# Patient Record
Sex: Female | Born: 1990 | Race: White | Hispanic: No | Marital: Single | Smoking: Current every day smoker
Health system: Southern US, Community
[De-identification: ages and names within clinical notes are randomized; demographics above are authoritative.]

## PROBLEM LIST (undated history)

## (undated) ENCOUNTER — Inpatient Hospital Stay: Payer: Self-pay

## (undated) DIAGNOSIS — F4310 Post-traumatic stress disorder, unspecified: Secondary | ICD-10-CM

## (undated) DIAGNOSIS — F32A Depression, unspecified: Secondary | ICD-10-CM

## (undated) DIAGNOSIS — F411 Generalized anxiety disorder: Secondary | ICD-10-CM

## (undated) DIAGNOSIS — F329 Major depressive disorder, single episode, unspecified: Secondary | ICD-10-CM

## (undated) DIAGNOSIS — F603 Borderline personality disorder: Secondary | ICD-10-CM

## (undated) DIAGNOSIS — F909 Attention-deficit hyperactivity disorder, unspecified type: Secondary | ICD-10-CM

## (undated) HISTORY — PX: DILATION AND CURETTAGE OF UTERUS: SHX78

---
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
## 2008-09-02 ENCOUNTER — Emergency Department: Payer: Self-pay | Admitting: Emergency Medicine

## 2009-02-21 ENCOUNTER — Emergency Department: Payer: Self-pay | Admitting: Emergency Medicine

## 2009-09-03 ENCOUNTER — Inpatient Hospital Stay: Payer: Self-pay | Admitting: Internal Medicine

## 2009-09-04 ENCOUNTER — Inpatient Hospital Stay: Payer: Self-pay | Admitting: Psychiatry

## 2009-09-10 ENCOUNTER — Emergency Department: Payer: Self-pay | Admitting: Emergency Medicine

## 2009-09-11 ENCOUNTER — Emergency Department: Payer: Self-pay | Admitting: Emergency Medicine

## 2009-10-13 ENCOUNTER — Emergency Department: Payer: Self-pay | Admitting: Emergency Medicine

## 2010-01-12 ENCOUNTER — Emergency Department: Payer: Self-pay | Admitting: Emergency Medicine

## 2010-05-03 ENCOUNTER — Emergency Department: Payer: Self-pay | Admitting: Emergency Medicine

## 2010-11-29 ENCOUNTER — Observation Stay: Payer: Self-pay

## 2010-12-10 ENCOUNTER — Inpatient Hospital Stay: Payer: Self-pay | Admitting: Obstetrics & Gynecology

## 2011-02-02 ENCOUNTER — Emergency Department: Payer: Self-pay | Admitting: Emergency Medicine

## 2011-02-03 ENCOUNTER — Ambulatory Visit: Payer: Self-pay | Admitting: Internal Medicine

## 2011-02-28 ENCOUNTER — Emergency Department: Payer: Self-pay | Admitting: Emergency Medicine

## 2011-05-06 ENCOUNTER — Ambulatory Visit: Payer: Self-pay | Admitting: Internal Medicine

## 2011-05-29 ENCOUNTER — Emergency Department: Payer: Self-pay | Admitting: Emergency Medicine

## 2011-07-16 ENCOUNTER — Emergency Department: Payer: Self-pay | Admitting: Emergency Medicine

## 2011-08-29 ENCOUNTER — Emergency Department: Payer: Self-pay | Admitting: Emergency Medicine

## 2011-10-01 DIAGNOSIS — F1320 Sedative, hypnotic or anxiolytic dependence, uncomplicated: Secondary | ICD-10-CM | POA: Insufficient documentation

## 2011-10-25 ENCOUNTER — Emergency Department: Payer: Self-pay | Admitting: Emergency Medicine

## 2011-10-25 LAB — CBC
HCT: 43.6 % (ref 35.0–47.0)
HGB: 14.8 g/dL (ref 12.0–16.0)
MCHC: 33.9 g/dL (ref 32.0–36.0)

## 2011-10-25 LAB — COMPREHENSIVE METABOLIC PANEL
Albumin: 4.6 g/dL (ref 3.4–5.0)
Alkaline Phosphatase: 37 U/L — ABNORMAL LOW (ref 50–136)
Bilirubin,Total: 1.4 mg/dL — ABNORMAL HIGH (ref 0.2–1.0)
Co2: 26 mmol/L (ref 21–32)
Creatinine: 0.61 mg/dL (ref 0.60–1.30)
EGFR (Non-African Amer.): >60
Glucose: 98 mg/dL (ref 65–99)
Osmolality: 274 (ref 275–301)
Potassium: 3.3 mmol/L — ABNORMAL LOW (ref 3.5–5.1)
Sodium: 138 mmol/L (ref 136–145)

## 2011-10-25 LAB — URINALYSIS, COMPLETE
Bilirubin,UR: NEGATIVE
Blood: NEGATIVE
Ph: 5 (ref 4.5–8.0)
Protein: 30
Specific Gravity: 1.028 (ref 1.003–1.030)
Squamous Epithelial: 7

## 2011-10-25 LAB — PREGNANCY, URINE: Pregnancy Test, Urine: POSITIVE m[IU]/mL

## 2011-11-06 ENCOUNTER — Emergency Department: Payer: Self-pay | Admitting: Emergency Medicine

## 2011-12-24 ENCOUNTER — Emergency Department: Payer: Self-pay | Admitting: *Deleted

## 2011-12-24 LAB — URINALYSIS, COMPLETE
Bilirubin,UR: NEGATIVE
Leukocyte Esterase: NEGATIVE
Ph: 6 (ref 4.5–8.0)
Specific Gravity: 1.028 (ref 1.003–1.030)
Squamous Epithelial: 4
WBC UR: 6 /HPF (ref 0–5)

## 2011-12-24 LAB — CBC
HCT: 39.3 % (ref 35.0–47.0)
HGB: 13.5 g/dL (ref 12.0–16.0)
MCH: 32.4 pg (ref 26.0–34.0)
MCHC: 34.4 g/dL (ref 32.0–36.0)
Platelet: 252 10*3/uL (ref 150–440)

## 2011-12-24 LAB — COMPREHENSIVE METABOLIC PANEL
Alkaline Phosphatase: 29 U/L — ABNORMAL LOW (ref 50–136)
Bilirubin,Total: 1 mg/dL (ref 0.2–1.0)
Calcium, Total: 9 mg/dL (ref 8.5–10.1)
Chloride: 99 mmol/L (ref 98–107)
Co2: 26 mmol/L (ref 21–32)
EGFR (African American): >60
SGPT (ALT): 20 U/L
Sodium: 137 mmol/L (ref 136–145)

## 2011-12-24 LAB — HCG, QUANTITATIVE, PREGNANCY: Beta Hcg, Quant.: 37294 m[IU]/mL — ABNORMAL HIGH

## 2012-04-11 ENCOUNTER — Observation Stay: Payer: Self-pay

## 2012-04-11 LAB — URINALYSIS, COMPLETE
Bilirubin,UR: NEGATIVE
Blood: NEGATIVE
Glucose,UR: NEGATIVE mg/dL (ref 0–75)
Ketone: NEGATIVE
Nitrite: NEGATIVE
Ph: 6 (ref 4.5–8.0)
Squamous Epithelial: 12

## 2012-06-08 ENCOUNTER — Observation Stay: Payer: Self-pay | Admitting: Obstetrics and Gynecology

## 2012-06-12 ENCOUNTER — Observation Stay: Payer: Self-pay

## 2012-06-17 ENCOUNTER — Inpatient Hospital Stay: Payer: Self-pay | Admitting: Obstetrics and Gynecology

## 2012-06-17 ENCOUNTER — Encounter: Payer: Self-pay | Admitting: Maternal and Fetal Medicine

## 2012-06-17 LAB — CBC WITH DIFFERENTIAL/PLATELET
Basophil #: 0 10*3/uL (ref 0.0–0.1)
Basophil %: 0.3 %
Eosinophil %: 0.4 %
Lymphocyte #: 2.7 10*3/uL (ref 1.0–3.6)
MCH: 32.2 pg (ref 26.0–34.0)
Monocyte #: 1.3 x10 3/mm — ABNORMAL HIGH (ref 0.2–0.9)
Monocyte %: 9.2 %
Neutrophil #: 9.6 10*3/uL — ABNORMAL HIGH (ref 1.4–6.5)
Platelet: 228 10*3/uL (ref 150–440)
RBC: 3.94 10*6/uL (ref 3.80–5.20)
RDW: 13 % (ref 11.5–14.5)
WBC: 13.7 10*3/uL — ABNORMAL HIGH (ref 3.6–11.0)

## 2012-06-18 LAB — HEMATOCRIT: HCT: 34.1 % — ABNORMAL LOW (ref 35.0–47.0)

## 2012-08-07 ENCOUNTER — Emergency Department: Payer: Self-pay | Admitting: Emergency Medicine

## 2012-08-07 LAB — CBC
HCT: 41.2 % (ref 35.0–47.0)
HGB: 14 g/dL (ref 12.0–16.0)
MCH: 31.5 pg (ref 26.0–34.0)
MCHC: 34 g/dL (ref 32.0–36.0)
MCV: 93 fL (ref 80–100)
RBC: 4.45 10*6/uL (ref 3.80–5.20)
WBC: 11.7 10*3/uL — ABNORMAL HIGH (ref 3.6–11.0)

## 2012-08-07 LAB — URINALYSIS, COMPLETE
Bilirubin,UR: NEGATIVE
Blood: NEGATIVE
Glucose,UR: NEGATIVE mg/dL (ref 0–75)
Ph: 6 (ref 4.5–8.0)
Protein: 30
Specific Gravity: 1.03 (ref 1.003–1.030)
Squamous Epithelial: 6

## 2012-08-07 LAB — BASIC METABOLIC PANEL
Chloride: 105 mmol/L (ref 98–107)
Co2: 24 mmol/L (ref 21–32)
Creatinine: 0.8 mg/dL (ref 0.60–1.30)
Sodium: 138 mmol/L (ref 136–145)

## 2012-08-07 LAB — HCG, QUANTITATIVE, PREGNANCY: Beta Hcg, Quant.: <1 m[IU]/mL — ABNORMAL LOW

## 2012-08-11 LAB — URINE CULTURE

## 2012-09-08 ENCOUNTER — Emergency Department: Payer: Self-pay | Admitting: Emergency Medicine

## 2012-09-08 LAB — URINALYSIS, COMPLETE
Glucose,UR: NEGATIVE mg/dL (ref 0–75)
Nitrite: NEGATIVE
RBC,UR: 125 /HPF (ref 0–5)
Specific Gravity: 1.027 (ref 1.003–1.030)
Squamous Epithelial: 6
WBC UR: 87 /HPF (ref 0–5)

## 2012-09-12 ENCOUNTER — Emergency Department: Payer: Self-pay | Admitting: Emergency Medicine

## 2012-09-12 LAB — BASIC METABOLIC PANEL
Chloride: 105 mmol/L (ref 98–107)
Co2: 27 mmol/L (ref 21–32)
Creatinine: 0.83 mg/dL (ref 0.60–1.30)
EGFR (African American): >60
EGFR (Non-African Amer.): >60
Sodium: 138 mmol/L (ref 136–145)

## 2012-09-12 LAB — CBC
HCT: 39.1 % (ref 35.0–47.0)
HGB: 12.6 g/dL (ref 12.0–16.0)
MCH: 30.3 pg (ref 26.0–34.0)
MCV: 94 fL (ref 80–100)
Platelet: 209 10*3/uL (ref 150–440)
RDW: 13.9 % (ref 11.5–14.5)
WBC: 13.2 10*3/uL — ABNORMAL HIGH (ref 3.6–11.0)

## 2012-09-20 DIAGNOSIS — F17200 Nicotine dependence, unspecified, uncomplicated: Secondary | ICD-10-CM | POA: Insufficient documentation

## 2012-11-05 ENCOUNTER — Emergency Department: Payer: Self-pay | Admitting: Emergency Medicine

## 2012-11-05 LAB — URINALYSIS, COMPLETE
Bacteria: NONE SEEN
Bilirubin,UR: NEGATIVE
Blood: NEGATIVE
Granular Cast: 10
Ph: 6 (ref 4.5–8.0)
RBC,UR: 4 /HPF (ref 0–5)
Specific Gravity: 1.028 (ref 1.003–1.030)
Squamous Epithelial: 4

## 2012-11-05 LAB — COMPREHENSIVE METABOLIC PANEL
Alkaline Phosphatase: 38 U/L — ABNORMAL LOW (ref 50–136)
Anion Gap: 12 (ref 7–16)
BUN: 8 mg/dL (ref 7–18)
Bilirubin,Total: 2 mg/dL — ABNORMAL HIGH (ref 0.2–1.0)
Calcium, Total: 8.8 mg/dL (ref 8.5–10.1)
Chloride: 106 mmol/L (ref 98–107)
Co2: 19 mmol/L — ABNORMAL LOW (ref 21–32)
Creatinine: 0.86 mg/dL (ref 0.60–1.30)
EGFR (African American): >60
Glucose: 160 mg/dL — ABNORMAL HIGH (ref 65–99)
Sodium: 137 mmol/L (ref 136–145)
Total Protein: 7.1 g/dL (ref 6.4–8.2)

## 2012-11-05 LAB — CBC
MCH: 30.7 pg (ref 26.0–34.0)
MCHC: 33.1 g/dL (ref 32.0–36.0)
Platelet: 295 10*3/uL (ref 150–440)
RDW: 13.3 % (ref 11.5–14.5)
WBC: 14.2 10*3/uL — ABNORMAL HIGH (ref 3.6–11.0)

## 2012-11-05 LAB — ETHANOL: Ethanol %: <0.003 % (ref 0.000–0.080)

## 2012-11-05 LAB — PREGNANCY, URINE: Pregnancy Test, Urine: NEGATIVE m[IU]/mL
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
## 2013-08-06 ENCOUNTER — Emergency Department: Payer: Self-pay | Admitting: Emergency Medicine

## 2013-08-06 LAB — URINALYSIS, COMPLETE
Nitrite: NEGATIVE
Specific Gravity: 1.016 (ref 1.003–1.030)
Squamous Epithelial: 6

## 2013-08-06 LAB — WET PREP, GENITAL

## 2013-08-06 LAB — GC/CHLAMYDIA PROBE AMP

## 2013-10-30 DIAGNOSIS — F1210 Cannabis abuse, uncomplicated: Secondary | ICD-10-CM | POA: Insufficient documentation

## 2013-10-30 DIAGNOSIS — F4310 Post-traumatic stress disorder, unspecified: Secondary | ICD-10-CM | POA: Insufficient documentation

## 2013-10-30 DIAGNOSIS — F603 Borderline personality disorder: Secondary | ICD-10-CM | POA: Insufficient documentation

## 2013-10-31 DIAGNOSIS — F489 Nonpsychotic mental disorder, unspecified: Secondary | ICD-10-CM | POA: Insufficient documentation

## 2014-03-11 DIAGNOSIS — F1320 Sedative, hypnotic or anxiolytic dependence, uncomplicated: Secondary | ICD-10-CM | POA: Insufficient documentation

## 2014-03-11 DIAGNOSIS — F1020 Alcohol dependence, uncomplicated: Secondary | ICD-10-CM | POA: Insufficient documentation

## 2014-07-29 ENCOUNTER — Emergency Department: Payer: Self-pay | Admitting: Emergency Medicine

## 2014-07-29 LAB — BASIC METABOLIC PANEL
Anion Gap: 7 (ref 7–16)
BUN: 11 mg/dL (ref 7–18)
CO2: 25 mmol/L (ref 21–32)
Calcium, Total: 8.8 mg/dL (ref 8.5–10.1)
Chloride: 106 mmol/L (ref 98–107)
Creatinine: 0.69 mg/dL (ref 0.60–1.30)
EGFR (Non-African Amer.): >60
GLUCOSE: 86 mg/dL (ref 65–99)
OSMOLALITY: 274 (ref 275–301)
Potassium: 3.6 mmol/L (ref 3.5–5.1)
Sodium: 138 mmol/L (ref 136–145)

## 2014-07-29 LAB — CBC
HCT: 40.2 % (ref 35.0–47.0)
HGB: 13.4 g/dL (ref 12.0–16.0)
MCH: 31.3 pg (ref 26.0–34.0)
MCHC: 33.2 g/dL (ref 32.0–36.0)
MCV: 94 fL (ref 80–100)
Platelet: 269 10*3/uL (ref 150–440)
RBC: 4.27 10*6/uL (ref 3.80–5.20)
RDW: 13.4 % (ref 11.5–14.5)
WBC: 11.8 10*3/uL — AB (ref 3.6–11.0)

## 2014-07-29 LAB — URINALYSIS, COMPLETE
BILIRUBIN, UR: NEGATIVE
Bacteria: NONE SEEN
Blood: NEGATIVE
Glucose,UR: NEGATIVE mg/dL (ref 0–75)
LEUKOCYTE ESTERASE: NEGATIVE
Nitrite: POSITIVE
PH: 6 (ref 4.5–8.0)
Protein: NEGATIVE
Specific Gravity: 1.012 (ref 1.003–1.030)
Squamous Epithelial: 4
WBC UR: <1 /HPF (ref 0–5)

## 2014-08-05 DIAGNOSIS — F1110 Opioid abuse, uncomplicated: Secondary | ICD-10-CM | POA: Insufficient documentation

## 2014-09-24 ENCOUNTER — Emergency Department: Payer: Self-pay | Admitting: Emergency Medicine

## 2014-09-25 LAB — COMPREHENSIVE METABOLIC PANEL
ALBUMIN: 4.1 g/dL (ref 3.4–5.0)
ALT: 19 U/L
ANION GAP: 9 (ref 7–16)
AST: 12 U/L — AB (ref 15–37)
Alkaline Phosphatase: 31 U/L — ABNORMAL LOW
BUN: 8 mg/dL (ref 7–18)
Bilirubin,Total: 1.2 mg/dL — ABNORMAL HIGH (ref 0.2–1.0)
Calcium, Total: 9.2 mg/dL (ref 8.5–10.1)
Chloride: 103 mmol/L (ref 98–107)
Co2: 27 mmol/L (ref 21–32)
Creatinine: 0.53 mg/dL — ABNORMAL LOW (ref 0.60–1.30)
EGFR (African American): >60
Glucose: 87 mg/dL (ref 65–99)
Osmolality: 275 (ref 275–301)
Potassium: 3.6 mmol/L (ref 3.5–5.1)
SODIUM: 139 mmol/L (ref 136–145)
Total Protein: 7.4 g/dL (ref 6.4–8.2)

## 2014-09-25 LAB — URINALYSIS, COMPLETE
BACTERIA: NONE SEEN
BILIRUBIN, UR: NEGATIVE
Blood: NEGATIVE
GLUCOSE, UR: NEGATIVE mg/dL (ref 0–75)
Leukocyte Esterase: NEGATIVE
NITRITE: NEGATIVE
Ph: 7 (ref 4.5–8.0)
Protein: 100
Specific Gravity: 1.026 (ref 1.003–1.030)
Squamous Epithelial: 1

## 2014-09-25 LAB — CBC
HCT: 39.6 % (ref 35.0–47.0)
HGB: 13.3 g/dL (ref 12.0–16.0)
MCH: 32.4 pg (ref 26.0–34.0)
MCHC: 33.7 g/dL (ref 32.0–36.0)
MCV: 96 fL (ref 80–100)
PLATELETS: 215 10*3/uL (ref 150–440)
RBC: 4.12 10*6/uL (ref 3.80–5.20)
RDW: 12.4 % (ref 11.5–14.5)
WBC: 13 10*3/uL — ABNORMAL HIGH (ref 3.6–11.0)

## 2014-09-25 LAB — LIPASE, BLOOD: Lipase: 49 U/L — ABNORMAL LOW (ref 73–393)

## 2014-09-25 LAB — HCG, QUANTITATIVE, PREGNANCY: BETA HCG, QUANT.: 105836 m[IU]/mL — AB

## 2014-10-02 NOTE — L&D Delivery Note (Signed)
Delivery Note At  a viable Female Kyla Balzarine(Tatiana) was delivered via  [employer] [facility]).  APGAR:6 ,9 ; weight 4 lb 5 oz (1955 g).   Placenta status: expressed, 3 vessel  Cord:  with the following complications: .  Cord pH: sent  Anesthesia:  Epidural Episiotomy:  None Lacerations:  None Suture Repair: NA Est. Blood Loss (mL):  350  Mom to postpartum.  Baby to Couplet care / Skin to Skin.  Daphine DeutscherMartin A Defrancesco 04/21/2015, 8:35 PM

## 2014-10-09 ENCOUNTER — Emergency Department: Payer: Self-pay | Admitting: Emergency Medicine

## 2014-10-16 ENCOUNTER — Ambulatory Visit: Payer: Self-pay | Admitting: Family Medicine

## 2014-12-16 DIAGNOSIS — Z62810 Personal history of physical and sexual abuse in childhood: Secondary | ICD-10-CM | POA: Insufficient documentation

## 2015-01-14 ENCOUNTER — Encounter
Admit: 2015-01-14 | Disposition: A | Discharge status: Home / Self Care | Payer: Self-pay | Attending: Obstetrics & Gynecology | Admitting: Obstetrics & Gynecology

## 2015-01-15 ENCOUNTER — Other Ambulatory Visit: Payer: Self-pay | Admitting: Obstetrics & Gynecology

## 2015-01-15 DIAGNOSIS — O283 Abnormal ultrasonic finding on antenatal screening of mother: Secondary | ICD-10-CM

## 2015-02-09 NOTE — H&P (Signed)
L&D Evaluation:  History:   HPI 24 yo G3 P1011, EDD of 06/21/12 per 13 week US, [facility] at 39 3/7 weeks [facility] after U/S which showed IUGR and oligohydramnios. [facility] notable for early entry to care, Rh negative (rec'd rhogam on 6/24), closely spaced pregnancies. [facility]<D [facility] which showed low fluid, S<D, and enlarged fetal bladder and [facility] U/S. Today it was found that EFW was <5% for 39 weeks and AFI was 4.8 cm, the bladder appeared normal. GBS Negative    Presents with other, IOL due to IUGR and oligohydramnios    Patient's Medical History No Chronic Illness    Patient's Surgical History D&C    Medications Pre Natal Vitamins    Allergies NKDA    Social History tobacco  smoking   ROS:   ROS see HPI   Exam:   Vital Signs stable    General no apparent distress    Mental Status clear    Abdomen gravid, tender with contractions, irregular contractions    Estimated Fetal Weight Small for gestational age, EFW 5-10 per today's U/S (<5%)    Edema no edema    Pelvic no external lesions, 2/50/-2 on last check    Mebranes Intact    FHT normal rate with no decels    Ucx irregular    Ucx Frequency 10 min    Skin dry   Impression:   Impression 39 3/7 weeks with oligohydramnios and IUGR   Plan:   Plan EFM/NST, IOL after reassurring tracing   Electronic Signatures: Shella MaximPutnam, Laiya Wisby (CNM)  (Signed 16-Sep-13 17:05)  Authored: L&D Evaluation   Last Updated: 16-Sep-13 17:05 by Shella MaximPutnam, Jacquette Canales (CNM)

## 2015-02-09 NOTE — H&P (Signed)
L&D Evaluation:  History:   HPI see 11 JULY note (late entry)   Electronic Signatures: Trinna BalloonGutierrez, Emillio Ngo L (CNM)  (Signed 12-Jul-13 08:58)  Authored: L&D Evaluation   Last Updated: 12-Jul-13 08:58 by Trinna BalloonGutierrez, Sylvi Rybolt L (CNM)

## 2015-02-09 NOTE — H&P (Signed)
L&D Evaluation:  History Expanded:   HPI 24 yo G3 P1011, EDD of 06/21/12 per 17 week US, presents at 38 5/7 weeks for evaluation after being told at office that AFI was low. Chart note from office states [facility] S<D, low AFI and enlarged fetal bladder. Pt reports occasional contractions, cervix was 2 cm at office today. Also reporting leaking fluid x1 month. Admits to drinking about 40 oz water/day and drinking 1 L Grape Soda q 4 days.    Group B Strep Results Maternal (Result >5wks must be treated as unknown) negative    Patient's Medical History No Chronic Illness    Patient's Surgical History D&C    Medications Pre Natal Vitamins    Allergies NKDA    Social History tobacco  smoking   ROS:   ROS see HPI   Exam:   Vital Signs stable    General no apparent distress    Mental Status clear    Abdomen gravid, tender with contractions, irregular contractions    Edema no edema    Pelvic no external lesions, 2/50/-2    Mebranes Intact, Fern negative    FHT normal rate with no decels    Ucx irregular    Ucx Frequency 10 min    Skin dry   Impression:   Impression intact membranes, reassuring fetal tracing   Plan:   Plan discharge    Comments F/u with [facility]   Electronic Signatures: Vella KohlerBrothers, Charnette Younkin K (CNM)  (Signed 11-Sep-13 21:37)  Authored: L&D Evaluation   Last Updated: 11-Sep-13 21:37 by Vella KohlerBrothers, Skiler Tye K (CNM)

## 2015-02-09 NOTE — H&P (Signed)
L&D Evaluation:  History:   HPI 24 year old WF G3 P1011 with EDC=06/21/2012 presents at 6229 6/7 weeks with c/o lower abdominal painsfor 2-3 days that she describes as a "scooping out" feeling. Since early this AM she has felt this feeling 3 times and it lasts for about 2-5 minutes at a time. The pain may be accompanied by tightening at times. Denies VB, but has had an increase in white vaginal discharge. Denies itching or odor with discharge. Has had no dysuria or hematuria or diarrhea. Still using Zofran 1-3 times a day for nausea, and has some constipation issues R/T this med. Did have a BM this AM. Woke up late this AM-drank some soda and has not eaten. Baby active. [facility] for unsure dates, a normal anatomy scan, close interconceptual spacing, smoking, and being RH neg. Did receive her Rhogam at 28 weeks. Hx of an SVD at term in March 2012    Presents with abdominal pain    Patient's Medical History Past hx of anxiety/depression/?bipolar (on previous hx)    Patient's Surgical History D&C    Medications Pre [place]atal Vitamins  Zofran    Allergies NKDA    Social History tobacco    Family History Non-Contributory   ROS:   ROS see HPI    General normal    GI see HPI    GU see HPI    Resp normal    CV normal   Exam:   Vital Signs 114/60    Urine Protein 1+, sp gravity=1.024, 8 WBC, +12epi cells, trace leuks    General no apparent distress, sighing frequently    Mental Status clear    Chest clear    Heart normal sinus rhythm, no murmur/gallop/rubs    Abdomen gravid, non-tender, cephalic presentation on Leopold's, mild tenderness in LUS with Leopold's, BS active. No guarding    Estimated Fetal Weight Average for gestational age    Edema no edema    Pelvic small weite mucoepithelial discharge. wet prep negative. ext os 1 cm, internal os closed/long/-3    Mebranes Intact    FHT normal rate with no decels, 140s baseline with accels to 180s, mod variability     Fetal Heart Rate 145    Ucx 1-2 contractions in 1 1/2 hours    Skin dry   Impression:   Impression IUP at 29 6/7 weeks with probable discomforts of pregnancy.  R/O UTI. Reactive NST   Plan:   Plan urine C&S pending. DC home with precautions. [facility]. Increase fluids.   Electronic Signatures: Trinna BalloonGutierrez, Garik Diamant L (CNM)  (Signed 12-Jul-13 08:56)  Authored: L&D Evaluation   Last Updated: 12-Jul-13 08:56 by Trinna BalloonGutierrez, Joelly Bolanos L (CNM)

## 2015-02-15 ENCOUNTER — Ambulatory Visit
Admission: RE | Admit: 2015-02-15 | Discharge: 2015-02-15 | Disposition: A | Discharge status: Home / Self Care | Payer: Medicaid Other | Source: Ambulatory Visit | Attending: Maternal & Fetal Medicine | Admitting: Maternal & Fetal Medicine

## 2015-02-15 DIAGNOSIS — Z3A27 27 weeks gestation of pregnancy: Secondary | ICD-10-CM | POA: Insufficient documentation

## 2015-02-15 DIAGNOSIS — O283 Abnormal ultrasonic finding on antenatal screening of mother: Secondary | ICD-10-CM

## 2015-02-15 DIAGNOSIS — O365920 Maternal care for other known or suspected poor fetal growth, second trimester, not applicable or unspecified: Secondary | ICD-10-CM | POA: Insufficient documentation

## 2015-02-15 DIAGNOSIS — IMO0002 Reserved for concepts with insufficient information to code with codable children: Secondary | ICD-10-CM

## 2015-02-18 ENCOUNTER — Inpatient Hospital Stay
Admission: RE | Admit: 2015-02-18 | Discharge: 2015-02-18 | Disposition: A | Discharge status: Home / Self Care | Payer: Medicaid Other | Source: Ambulatory Visit

## 2015-02-18 DIAGNOSIS — IMO0002 Reserved for concepts with insufficient information to code with codable children: Secondary | ICD-10-CM

## 2015-02-22 ENCOUNTER — Other Ambulatory Visit: Payer: Medicaid Other

## 2015-02-25 ENCOUNTER — Other Ambulatory Visit: Payer: Self-pay | Admitting: Obstetrics & Gynecology

## 2015-02-25 ENCOUNTER — Ambulatory Visit
Admission: RE | Admit: 2015-02-25 | Discharge: 2015-02-25 | Disposition: A | Discharge status: Home / Self Care | Payer: Medicaid Other | Source: Ambulatory Visit | Attending: Maternal & Fetal Medicine | Admitting: Maternal & Fetal Medicine

## 2015-02-25 DIAGNOSIS — IMO0002 Reserved for concepts with insufficient information to code with codable children: Secondary | ICD-10-CM

## 2015-03-04 ENCOUNTER — Other Ambulatory Visit: Payer: Medicaid Other

## 2015-03-08 ENCOUNTER — Other Ambulatory Visit: Payer: Medicaid Other

## 2015-03-11 ENCOUNTER — Ambulatory Visit
Admission: RE | Admit: 2015-03-11 | Discharge: 2015-03-11 | Disposition: A | Discharge status: Home / Self Care | Payer: Medicaid Other | Source: Ambulatory Visit | Attending: Maternal & Fetal Medicine | Admitting: Maternal & Fetal Medicine

## 2015-03-11 DIAGNOSIS — Z3A31 31 weeks gestation of pregnancy: Secondary | ICD-10-CM | POA: Insufficient documentation

## 2015-03-11 DIAGNOSIS — O365930 Maternal care for other known or suspected poor fetal growth, third trimester, not applicable or unspecified: Secondary | ICD-10-CM | POA: Insufficient documentation

## 2015-03-11 DIAGNOSIS — IMO0002 Reserved for concepts with insufficient information to code with codable children: Secondary | ICD-10-CM

## 2015-03-15 ENCOUNTER — Ambulatory Visit
Admission: RE | Admit: 2015-03-15 | Discharge: 2015-03-15 | Disposition: A | Discharge status: Home / Self Care | Payer: Medicaid Other | Source: Ambulatory Visit | Attending: Obstetrics & Gynecology | Admitting: Obstetrics & Gynecology

## 2015-03-15 ENCOUNTER — Other Ambulatory Visit: Payer: Medicaid Other

## 2015-03-15 VITALS — BP 118/81 | HR 101 | Temp 98.1°F | Wt 130.0 lb

## 2015-03-15 DIAGNOSIS — O365990 Maternal care for other known or suspected poor fetal growth, unspecified trimester, not applicable or unspecified: Secondary | ICD-10-CM

## 2015-03-15 NOTE — Progress Notes (Signed)
[facility]:  NST:  130s, moderate variability, reactive, no decels.  No contractions   Return on Thursday for BPP and dopplers  Gyanna Jarema, [place] A, MD

## 2015-03-18 ENCOUNTER — Encounter: Payer: Self-pay | Admitting: *Deleted

## 2015-03-18 ENCOUNTER — Other Ambulatory Visit: Payer: Medicaid Other

## 2015-03-18 ENCOUNTER — Observation Stay
Admission: RE | Admit: 2015-03-18 | Discharge: 2015-03-18 | Disposition: A | Discharge status: Home / Self Care | Payer: Medicaid Other | Source: Ambulatory Visit | Attending: Obstetrics & Gynecology | Admitting: Obstetrics & Gynecology

## 2015-03-18 ENCOUNTER — Ambulatory Visit
Admission: RE | Admit: 2015-03-18 | Discharge: 2015-03-18 | Disposition: A | Discharge status: Home / Self Care | Payer: Medicaid Other | Source: Ambulatory Visit | Attending: Maternal & Fetal Medicine | Admitting: Maternal & Fetal Medicine

## 2015-03-18 DIAGNOSIS — Z3A32 32 weeks gestation of pregnancy: Secondary | ICD-10-CM | POA: Diagnosis not present

## 2015-03-18 DIAGNOSIS — IMO0002 Reserved for concepts with insufficient information to code with codable children: Secondary | ICD-10-CM

## 2015-03-18 DIAGNOSIS — O365990 Maternal care for other known or suspected poor fetal growth, unspecified trimester, not applicable or unspecified: Secondary | ICD-10-CM

## 2015-03-18 DIAGNOSIS — O479 False labor, unspecified: Secondary | ICD-10-CM | POA: Diagnosis present

## 2015-03-18 HISTORY — DX: Depression, unspecified: F32.A

## 2015-03-18 HISTORY — DX: Major depressive disorder, single episode, unspecified: F32.9

## 2015-03-18 LAB — URINALYSIS COMPLETE WITH MICROSCOPIC (ARMC ONLY)
BILIRUBIN URINE: NEGATIVE
Bacteria, UA: NONE SEEN
GLUCOSE, UA: NEGATIVE mg/dL
Hgb urine dipstick: NEGATIVE
KETONES UR: NEGATIVE mg/dL
LEUKOCYTES UA: NEGATIVE
NITRITE: NEGATIVE
Protein, ur: NEGATIVE mg/dL
RBC / HPF: NONE SEEN RBC/hpf (ref 0–5)
SPECIFIC GRAVITY, URINE: 1.005 (ref 1.005–1.030)
pH: 7 (ref 5.0–8.0)

## 2015-03-18 LAB — FETAL FIBRONECTIN: Fetal Fibronectin: NEGATIVE

## 2015-03-18 MED ORDER — ACETAMINOPHEN 325 MG PO TABS: 650.0000 mg | ORAL_TABLET | ORAL | Status: DC | PRN

## 2015-03-18 NOTE — Discharge Instructions (Signed)
Intrauterine Growth Restriction Intrauterine growth restriction (IUGR) means that the baby is smaller than normal at the time of the pregnancy or at birth. This should not be confused with Small for Gestational Age (SGA), which means the baby's weight at birth is at the lower end (less than 10%) of normal birth weights.  CAUSES  Medical problems with the mother:  High blood pressure.  Kidney, lung or heart disease.  Diabetes with arteriosclerosis.  Hemoglobinopathies- blood diseases.  Antiphospholipid antibody syndrome - a disorder of the immune system. Other causes:  Smoking, drug abuse and excessive alcohol drinking.  Diseases of the placenta.  Having twins or more.  Malnutrition.  Infections.  Genetic problems.  Pregnant women 16 years old or younger and pregnant women 35 years old or older.  Exposure to toxic chemicals. SYMPTOMS  Smaller than normal uterus when measuring the uterine size on the abdomen.  Ultrasound measurements of the fetuses head circumference, the abdominal circumference, the diameter of the biparietal area (sides) of the head and the length of the femur less than normal indicating IUGR. RISKS AND COMPLICATIONS  Fetal death in the uterus or a stillborn baby.  Not having enough fluid in the baby's sac (oligohydramnios).  Fetal heart rate problems. This leads to more Cesarean Section deliveries.  Low Apgar scores (evaluates the baby's condition at birth).  Increase in the acidity of the baby's blood (acidosis). SGA babies can also have complications such as:  Low blood sugar.  Increase of bilirubin in the blood.  Low body temperature (hypothermia)  Low Apgar scores, convulsions, fetal death and stillborn. TREATMENT   Close following and monitoring of the fetus during the pregnancy.  Treat infections that may be present.  Treat and control the medical disease present.  Look at the condition of the fetus with non-tress tests,  contraction stress tests and biophysical profile of the fetus.  Doppler ultrasound (measure the umbilical artery blood flow) lowers the risk of fetal death and stillborn by delivering the baby early if it is abnormal. HOME CARE INSTRUCTIONS   Follow your caregiver's advice, instructions and keep all of your prenatal appointments.  Get plenty of rest and sleep.  Eat a balanced diet and take all your vitamin and mineral supplements.  Do not over use your energy with hard exercise, work and household activities.  Do not exercise unless your caregiver says it is OK to do so.  Do not smoke, drink alcohol or take illegal drugs.  Avoid chemicals like pesticides. SEEK MEDICAL CARE IF:   You develop a temperature of 100 F (37.8 C) or higher.  You do not feel the baby moving as much or not at all.  You develop leaking of fluid from the vagina.  You develop vaginal bleeding.  You develop abdominal pain.  You develop uterine contractions. Document Released: 06/27/2008 Document Revised: 02/02/2014 Document Reviewed: 06/27/2008 ExitCare Patient Information 2015 [employer]. This information is not intended to replace advice given to you by your health care provider. Make sure you discuss any questions you have with your health care provider.  

## 2015-03-18 NOTE — OB Triage Note (Signed)
Recvd from [facility] with contractions noted per fetal monitoring in the office.  Changed to gown and to bed.  EFM applied.  Plan of care discussed.

## 2015-03-18 NOTE — H&P (Signed)
Obstetrics Admission History & Physical     HPI:  24 y.o. G4P3 @ [redacted]w[redacted]d (05/12/2015, by Last Menstrual Period). Admitted on 03/18/2015:   Patient Active Problem List   Diagnosis Date Noted  . Preterm labor 03/18/2015  . Pregnancy affected by fetal growth restriction 03/15/2015     Presents for contractions.   [facility] at: at [facility] and at [facility]  PMHx:  Past Medical History  Diagnosis Date  . Depression    PSHx:  Past Surgical History  Procedure Laterality Date  . Dilation and curettage of uterus     Medications:  Prescriptions prior to admission  Medication Sig Dispense Refill Last Dose  . clonazePAM (KLONOPIN) 0.5 MG tablet Take 0.5 mg by mouth daily.   Unknown at Unknown time  . hydrOXYzine (ATARAX/VISTARIL) 25 MG tablet Take 25 mg by mouth 3 (three) times daily as needed for anxiety.   Unknown at Unknown time  . ranitidine (ZANTAC) 150 MG tablet Take 150 mg by mouth 2 (two) times daily. As needed   Unknown at Unknown time  . sertraline (ZOLOFT) 100 MG tablet Take 150 mg by mouth daily.   Unknown at Unknown time  . traZODone (DESYREL) 100 MG tablet Take 100 mg by mouth at bedtime.   Unknown at Unknown time   Allergies: has No Known Allergies. OBHx:  OB History  Gravida Para Term Preterm AB SAB TAB Ectopic Multiple Living  4 3            # Outcome Date GA Lbr Len/2nd Weight Sex Delivery Anes PTL Lv  4 Current           3 Para           2 Para           1 Para              JHE:RDEYCXKG/YJEHUDJSHFWY except as detailed in HPI. Soc Hx: Current smoker  Objective:   Filed Vitals:   03/18/15 1250  BP: 112/67  Pulse: 66  Temp: 98.1 F (36.7 C)  Resp: 16   General: Well nourished, well developed female in no acute distress.  Skin: Warm and dry.  Cardiovascular:Regular rate and rhythm. Respiratory: Clear to auscultation bilateral. Normal respiratory effort Abdomen: NT, ND Neuro/Psych: Normal mood and affect.   Pelvic exam: is not limited by body  habitus EGBUS: within normal limits Vagina: within normal limits and with normal mucosa blood in the vault Cervix: cl, th, high Uterus: Spontaneous uterine activity  Adnexa: normal adnexa  EFM:FHR: 140 bpm, variability: moderate,  accelerations:  Present,  decelerations:  Absent Toco: Frequency: Every 10 minutes  Assessment & Plan:   24 y.o. G4P3 @ [redacted]w[redacted]d, Admitted on 03/18/2015: Early R/O labor or Preterm Labor.  Fetal fibronectin Monitoring Fluids  Pt is also seen by DP for IUGR, BPPs and dopplers Ok today.

## 2015-03-18 NOTE — ED Notes (Signed)
Report called to T. Chemical engineer and C. Subudhi, CNM.

## 2015-03-18 NOTE — ED Notes (Signed)
Patient taken via wheelchair to BP per Dr. Fayrene Fearing order for further monitoring.

## 2015-03-18 NOTE — Discharge Summary (Signed)
Gynecology Discharge Summary Date of Admission: 03/18/2015 Date of Discharge: 03/18/2015  The patient was admitted, as scheduled, and underwent a NST; please refer to  note for full details.  She was neg for PREETERM LABOR and discharged to home.   NST Reactive. fFN neg. UA neg. Min contractions, no cervical change.    Medication List    ASK your doctor about these medications        clonazePAM 0.5 MG tablet  Commonly known as:  KLONOPIN  Take 0.5 mg by mouth daily.     hydrOXYzine 25 MG tablet  Commonly known as:  ATARAX/VISTARIL  Take 25 mg by mouth 3 (three) times daily as needed for anxiety.     ranitidine 150 MG tablet  Commonly known as:  ZANTAC  Take 150 mg by mouth 2 (two) times daily. As needed     sertraline 100 MG tablet  Commonly known as:  ZOLOFT  Take 150 mg by mouth daily.     traZODone 100 MG tablet  Commonly known as:  DESYREL  Take 100 mg by mouth at bedtime.        Future Appointments Date Time Provider Department Center  03/22/2015 3:00 PM [facility] NST [facility] None  03/25/2015 4:00 PM [facility] [place] [facility]  03/29/2015 4:00 PM [facility] NST [facility] None  04/01/2015 4:00 PM [facility] [place] [facility]    Prudencio Burly, MD [facility]

## 2015-03-22 ENCOUNTER — Other Ambulatory Visit: Payer: Medicaid Other

## 2015-03-22 ENCOUNTER — Ambulatory Visit
Admission: RE | Admit: 2015-03-22 | Discharge: 2015-03-22 | Disposition: A | Discharge status: Home / Self Care | Payer: Medicaid Other | Source: Ambulatory Visit | Attending: Obstetrics and Gynecology | Admitting: Obstetrics and Gynecology

## 2015-03-22 VITALS — BP 108/62 | HR 78 | Temp 97.4°F | Resp 18 | Wt 130.0 lb

## 2015-03-22 DIAGNOSIS — O365990 Maternal care for other known or suspected poor fetal growth, unspecified trimester, not applicable or unspecified: Secondary | ICD-10-CM

## 2015-03-22 NOTE — Progress Notes (Signed)
NST performed for FGR  Baseline FHR 120  Moderate variability  Positive accels  No decels -( area of baseline shift to 110)  Irregular frequent non painful UCs  Reactive NST Pt advised to hydrate and eat (she just woke up ) and go to L&D if Ucs become painful  Jimmey Ralph, MD

## 2015-03-25 ENCOUNTER — Other Ambulatory Visit: Payer: Medicaid Other

## 2015-03-25 ENCOUNTER — Ambulatory Visit
Admission: RE | Admit: 2015-03-25 | Discharge: 2015-03-25 | Disposition: A | Discharge status: Home / Self Care | Payer: Medicaid Other | Source: Ambulatory Visit | Attending: Maternal & Fetal Medicine | Admitting: Maternal & Fetal Medicine

## 2015-03-25 DIAGNOSIS — Z3A33 33 weeks gestation of pregnancy: Secondary | ICD-10-CM | POA: Diagnosis not present

## 2015-03-25 DIAGNOSIS — O365930 Maternal care for other known or suspected poor fetal growth, third trimester, not applicable or unspecified: Secondary | ICD-10-CM | POA: Diagnosis present

## 2015-03-25 DIAGNOSIS — IMO0002 Reserved for concepts with insufficient information to code with codable children: Secondary | ICD-10-CM

## 2015-03-29 ENCOUNTER — Ambulatory Visit
Admission: RE | Admit: 2015-03-29 | Discharge: 2015-03-29 | Disposition: A | Discharge status: Home / Self Care | Payer: Medicaid Other | Source: Ambulatory Visit | Attending: Maternal & Fetal Medicine | Admitting: Maternal & Fetal Medicine

## 2015-03-29 ENCOUNTER — Other Ambulatory Visit: Payer: Medicaid Other

## 2015-03-29 VITALS — BP 120/73 | HR 62 | Temp 98.3°F | Resp 18 | Wt 123.0 lb

## 2015-03-29 DIAGNOSIS — Z3A33 33 weeks gestation of pregnancy: Secondary | ICD-10-CM | POA: Insufficient documentation

## 2015-03-29 DIAGNOSIS — O365930 Maternal care for other known or suspected poor fetal growth, third trimester, not applicable or unspecified: Secondary | ICD-10-CM | POA: Insufficient documentation

## 2015-03-29 DIAGNOSIS — Z3490 Encounter for supervision of normal pregnancy, unspecified, unspecified trimester: Secondary | ICD-10-CM

## 2015-03-29 DIAGNOSIS — Z331 Pregnant state, incidental: Secondary | ICD-10-CM | POA: Diagnosis present

## 2015-03-29 DIAGNOSIS — O365990 Maternal care for other known or suspected poor fetal growth, unspecified trimester, not applicable or unspecified: Secondary | ICD-10-CM

## 2015-03-29 NOTE — Progress Notes (Signed)
Indication: IUGR.  ____________________________________________________________________________ History: Age: 24 years. ____________________________________________________________________________ Dating: LMP: 08/09/2014 EDC: 05/16/2015 GA by LMP: [redacted]w[redacted]d Best Overall Assessment: 03/18/2015 EDC: 05/12/2015 Assessed GA: [redacted]w[redacted]d The Best Overall Assessment is based on an earlier assessment on 10/16/2014. ____________________________________________________________________________ Fetal Wellbeing Assessment: Amniotic fluid: AFI: 14.5 cm. MVP: 4.6 cm. Q1: 2.9 cm. Q2: 2.6 cm. Q3: 4.4 cm. Q4: 4.6 cm.  Non Stress Test: Fetal heart rate: 140 bpm.  Biophysical Profile: Fetal body movements: normal (2), Fetal tone: normal (2), Fetal breathing movements: normal (2), Amniotic fluid volume: normal (2). Score 8 / 8.   ____________________________________________________________________________ Report Summary: Impression: Single live pregnancy at 33 5/7 weeks. Dating is by LMP consistent with earliest available ultrasound performed at [facility] on 10/16/14 with measurements of 10 2/7 weeks.  The patient was originally referred due to  findings of "placental bleeds and echogenic bowel" on ultrasound [facility] on 12[facility]17/14.  When she was evaluated here on 01/14/2015, there was the addtional finding of shortened long bones.  On 03/18/2015 at 31 1/[redacted] weeks gestation EFW was 1223g (less than the 3rd percentile).  Amniotic fluid volume was normal.  Doppler S/D ratio averaged 2.56 (upper limit of normal for this gestational age is 4.17).  BPP was 8/8.  Today, the fetus is cephalic. AFI is 14cm.  The BPP is 8/8.  These findings were reviewed. She will continue to have twice weekly antenatal testing (scheduled for bpp/dopplers in 3 days) and states she will have first visit with [facility] ob[facility] tomorrow. CODING DESCRIPTION: fetal growth restriction.  Recommendations:   Thank you for allowing [place] to participate

## 2015-04-01 ENCOUNTER — Other Ambulatory Visit: Payer: Self-pay | Admitting: Maternal & Fetal Medicine

## 2015-04-01 ENCOUNTER — Ambulatory Visit: Payer: Medicaid Other

## 2015-04-01 ENCOUNTER — Ambulatory Visit
Admission: RE | Admit: 2015-04-01 | Discharge: 2015-04-01 | Disposition: A | Discharge status: Home / Self Care | Payer: Medicaid Other | Source: Ambulatory Visit | Attending: Maternal & Fetal Medicine | Admitting: Maternal & Fetal Medicine

## 2015-04-01 DIAGNOSIS — O365930 Maternal care for other known or suspected poor fetal growth, third trimester, not applicable or unspecified: Secondary | ICD-10-CM | POA: Diagnosis not present

## 2015-04-01 DIAGNOSIS — IMO0002 Reserved for concepts with insufficient information to code with codable children: Secondary | ICD-10-CM

## 2015-04-01 DIAGNOSIS — Z3A34 34 weeks gestation of pregnancy: Secondary | ICD-10-CM | POA: Diagnosis not present

## 2015-04-01 DIAGNOSIS — O365990 Maternal care for other known or suspected poor fetal growth, unspecified trimester, not applicable or unspecified: Secondary | ICD-10-CM

## 2015-04-01 NOTE — Progress Notes (Addendum)
Indication: AFI, cord dopplers.  ____________________________________________________________________________ History: Age: 24 years. ____________________________________________________________________________ Dating: LMP: 08/09/2014 EDC: 05/16/2015 GA by LMP: [id number] Best Overall Assessment: 03/18/2015 EDC: 05/12/2015 Assessed GA: 5333w1d The Best Overall Assessment is based on an earlier assessment on 10/16/2014. ____________________________________________________________________________ Anatomy Scan: Singleton gestation.  Fetal presentation: cephalic.  Amniotic fluid: mildly reduced. AFI  8.5 cm. MVP 3.9 cm.  Placenta: anterior.   Fetal Anatomy: Visualized with normal appearance: gastrointestinal tract, bladder.   ____________________________________________________________________________ Report Summary: Impression: Single live pregnancy at 34 1/7 weeks. Dating is by LMP consistent with [facility] with measurements of 10 2/7 weeks.   The patient was originally referred due to findings of "placental bleeds and echogenic bowel" [facility]. When she was evaluated here on 01/14/2015, there was the addtional finding of shortened long bones.   On 03/18/2015 at 31 1/[redacted] weeks gestation EFW was 1223g (less than the 3rd percentile). Amniotic fluid volume was normal. Doppler S/D ratio was normal.   Today, The fetus is cephalic. AFI=8.5.  Umbilical artery dopplers were normal, averaging 2.48(upper limit of normal for this gestational age is 3.5).   NST 130s reactive, no ctx. CODING DESCRIPTION:  fetal growth restriction.  Recommendations: Continue twice weekly testing--she can have NSTs weekly with her obgyn practice and we can perform antenatal testing (nst or bpp) with afi and dopplers once weekly (bpp/doppers weekly ordered x 2 weeks) Will repeat fetal growth in 2 weeks (ordered)  Thank you for  allowing us to participate in her care.

## 2015-04-01 NOTE — ED Notes (Signed)
Attempted to make transfer of care appt and [facility]  [facility].  She needs f/u starting next week.  Appointment needed authorization for override from office manager.  [facility] to call patient tomorrow about appointment on Tues or Wednesday of next week and also to leave us a message at clinic on f/u status.

## 2015-04-04 ENCOUNTER — Encounter: Payer: Self-pay | Admitting: Advanced Practice Midwife

## 2015-04-04 ENCOUNTER — Observation Stay
Admission: EM | Admit: 2015-04-04 | Discharge: 2015-04-05 | Disposition: A | Discharge status: Home / Self Care | Payer: Medicaid Other | Attending: Obstetrics and Gynecology | Admitting: Obstetrics and Gynecology

## 2015-04-04 DIAGNOSIS — O4700 False labor before 37 completed weeks of gestation, unspecified trimester: Secondary | ICD-10-CM | POA: Diagnosis present

## 2015-04-04 DIAGNOSIS — Z3A34 34 weeks gestation of pregnancy: Secondary | ICD-10-CM | POA: Insufficient documentation

## 2015-04-04 DIAGNOSIS — O479 False labor, unspecified: Secondary | ICD-10-CM | POA: Diagnosis present

## 2015-04-04 HISTORY — DX: Borderline personality disorder: F60.3

## 2015-04-04 HISTORY — DX: Post-traumatic stress disorder, unspecified: F43.10

## 2015-04-04 HISTORY — DX: Generalized anxiety disorder: F41.1

## 2015-04-04 LAB — CBC
HEMATOCRIT: 35.7 % (ref 35.0–47.0)
HEMOGLOBIN: 12.3 g/dL (ref 12.0–16.0)
MCH: 32.1 pg (ref 26.0–34.0)
MCHC: 34.3 g/dL (ref 32.0–36.0)
MCV: 93.5 fL (ref 80.0–100.0)
PLATELETS: 199 10*3/uL (ref 150–440)
RBC: 3.82 MIL/uL (ref 3.80–5.20)
RDW: 12.7 % (ref 11.5–14.5)
WBC: 15.9 10*3/uL — AB (ref 3.6–11.0)

## 2015-04-04 LAB — URINALYSIS COMPLETE WITH MICROSCOPIC (ARMC ONLY)
BILIRUBIN URINE: NEGATIVE
Bacteria, UA: NONE SEEN
Glucose, UA: NEGATIVE mg/dL
HGB URINE DIPSTICK: NEGATIVE
KETONES UR: NEGATIVE mg/dL
Nitrite: NEGATIVE
PROTEIN: NEGATIVE mg/dL
Specific Gravity, Urine: 1.009 (ref 1.005–1.030)
pH: 7 (ref 5.0–8.0)

## 2015-04-04 LAB — SAMPLE TO BLOOD BANK: Blood Bank Specimen: UNDETERMINED

## 2015-04-04 MED ORDER — CLONAZEPAM 1 MG PO TABS
0.5000 mg | ORAL_TABLET | Freq: Every day | ORAL | Status: DC
  Administered 2015-04-04: 0.5 mg via ORAL

## 2015-04-04 MED ORDER — BUTORPHANOL TARTRATE 1 MG/ML IJ SOLN
1.0000 mg | Freq: Once | INTRAMUSCULAR | Status: AC
  Administered 2015-04-04 (×2): 1 mg via INTRAVENOUS

## 2015-04-04 MED ORDER — HYDROXYZINE HCL 25 MG PO TABS
25.0000 mg | ORAL_TABLET | Freq: Four times a day (QID) | ORAL | Status: DC | PRN
  Administered 2015-04-04: 25 mg via ORAL
  Filled 2015-04-04: qty 1

## 2015-04-04 MED ORDER — CLONAZEPAM 1 MG PO TABS
ORAL_TABLET | ORAL | Status: AC
  Administered 2015-04-04: 0.5 mg via ORAL
  Filled 2015-04-04: qty 1

## 2015-04-04 MED ORDER — BUTORPHANOL TARTRATE 1 MG/ML IJ SOLN
INTRAMUSCULAR | Status: AC
  Administered 2015-04-04: 1 mg via INTRAVENOUS
  Filled 2015-04-04: qty 1

## 2015-04-04 MED ORDER — LACTATED RINGERS IV SOLN
INTRAVENOUS | Status: DC
  Administered 2015-04-04: via INTRAVENOUS

## 2015-04-04 MED ORDER — NIFEDIPINE 10 MG PO CAPS
20.0000 mg | ORAL_CAPSULE | Freq: Once | ORAL | Status: AC
  Administered 2015-04-04: 20 mg via ORAL
  Filled 2015-04-04: qty 2

## 2015-04-04 MED ORDER — BUTORPHANOL TARTRATE 1 MG/ML IJ SOLN: 1.0000 mg | Freq: Once | INTRAMUSCULAR | Status: AC | PRN

## 2015-04-04 NOTE — Progress Notes (Signed)
S: Pt reporting pain is still severe. Denies improvement after IV bolus or 1 mg of stadol   O: Contractions now q 1-3 min, lasting 50-60 seconds. FHR 130 mod variability, + accels, no decels      Cervix: 1/80/-1 (no change from prior exam)  A: IUP at 8963w4d, preterm contractions, no cervical change in the past hour  P: Continue to monitor for contractions and cervical change -Procardia po, continue IV fluids -Will give evening anxiety medications (Hydroxyzine and Klonopin) as requested by pt -Hold Trazodone for now -May give another dose of stadol  -Will draw GBS swab now

## 2015-04-04 NOTE — OB Triage Provider Note (Signed)
Obstetric History and Physical  Dominique Frost is a 24 y.o. [phone] with Estimated Date of Delivery: 05/12/15 per 68w2dUKoreawho presents at 394w4dpresenting for abdominal pain and back pain. Patient states she has been having frequent contractions since last night with worsening pain this evening, no vaginal bleeding, intact membranes, with decreased  fetal movement.    Prenatal Course [facility] of [facility] at [facility] Pregnancy complications or risks: Pt is currently being comanaged by [facility] d/t IUGR (less than 3rd percentil at 31 weeks) Dopplars, AFIs and NSTs have been WNL.   Patient Active Problem List   Diagnosis Date Noted  . Preterm contractions 04/04/2015  . Preterm labor 03/18/2015  . Irregular contractions 03/18/2015  . Pregnancy affected by fetal growth restriction 03/15/2015     Prenatal labs and studies: ABO, Rh: O negative  Antibody: "RH isoimmunization" at 11 weeks, negative at 28 weeks Rubella: MMR x 2 Varicella: Immune RPR:  NR HBsAg:  neg HIV: neg GC/CT: neg/neg GBS: pending  1 hr Glucola: 1 hr: 153, 3 hr WNL   Genetic screening: declined    Prenatal Transfer Tool   Past Medical History  Diagnosis Date  . Depression   . PTSD (post-traumatic stress disorder)   . Borderline personality disorder   . Generalized anxiety disorder     Past Surgical History  Procedure Laterality Date  . Dilation and curettage of uterus      OB History  Gravida Para Term Preterm AB SAB TAB Ectopic Multiple Living  '4 2 2  1 1        ' # Outcome Date GA Lbr Len/2nd Weight Sex Delivery Anes PTL Lv  4 Current           3 Term 06/17/12 4028w0d lb 8 oz (2.495 kg)  Vag-Spont     2 Term 12/10/10 40w42w0dlb (3.175 kg) F      1 SAB 2006            Obstetric Comments  D&C after SAB    History   Social History  . Marital Status: Married    Spouse Name: N/A  . Number of Children: N/A  . Years of Education: N/A    Social History Main Topics  . Smoking status: Heavy Tobacco Smoker -- 0.50 packs/day for 10 years  . Smokeless tobacco: Never Used  . Alcohol Use: No  . Drug Use: No  . Sexual Activity: Yes   Other Topics Concern  . None   Social History Narrative    No family history on file.  Prescriptions prior to admission  Medication Sig Dispense Refill Last Dose  . clonazePAM (KLONOPIN) 0.5 MG tablet Take 0.5 mg by mouth daily.   Taking  . hydrOXYzine (ATARAX/VISTARIL) 25 MG tablet Take 25 mg by mouth 3 (three) times daily as needed for anxiety.   Taking  . Prenatal Vit-Fe Fumarate-FA (MULTIVITAMIN-PRENATAL) 27-0.8 MG TABS tablet Take 1 tablet by mouth daily at 12 noon.     . sertraline (ZOLOFT) 100 MG tablet Take 150 mg by mouth daily.   Taking  . traZODone (DESYREL) 100 MG tablet Take 100 mg by mouth at bedtime.   Taking  . [DISCONTINUED] ranitidine (ZANTAC) 150 MG tablet Take 150 mg by mouth 2 (two) times daily. As needed   Taking    Allergies  Allergen Reactions  . Clonidine  Other reaction(s): RASH  Clindamycin - rash  Review of Systems: Negative except for what is mentioned in HPI.  Physical Exam: LMP 08/05/2014 (Approximate) GENERAL: Well-developed, female, appears uncomfortable ABDOMEN: Soft, nontender, nondistended, gravid. EXTREMITIES: Nontender, no edema Cervical Exam: Dilatation 1 cm   Effacement 80 %   Station -1   Presentation: cephalic FHT: Category: 1 Baseline rate 140 bpm   Variability moderate  Accelerations present   Decelerations none Contractions: contractions q 1 min x 30 seconds each   Pertinent Labs/Studies:   Results for orders placed or performed during the hospital encounter of 04/04/15 (from the past 24 hour(s))  Urinalysis complete, with microscopic ([facility] only)     Status: Abnormal   Collection Time: 04/04/15 10:03 PM  Result Value Ref Range   Color, Urine YELLOW (A) YELLOW   APPearance HAZY (A) CLEAR   Glucose, UA NEGATIVE NEGATIVE mg/dL    Bilirubin Urine NEGATIVE NEGATIVE   Ketones, ur NEGATIVE NEGATIVE mg/dL   Specific Gravity, Urine 1.009 1.005 - 1.030   Hgb urine dipstick NEGATIVE NEGATIVE   pH 7.0 5.0 - 8.0   Protein, ur NEGATIVE NEGATIVE mg/dL   Nitrite NEGATIVE NEGATIVE   Leukocytes, UA TRACE (A) NEGATIVE   RBC / HPF 0-5 0 - 5 RBC/hpf   WBC, UA 0-5 0 - 5 WBC/hpf   Bacteria, UA NONE SEEN NONE SEEN   Squamous Epithelial / LPF 6-30 (A) NONE SEEN   Mucous PRESENT     Assessment : IUP at 57w4devaluation for preterm labor   Plan: Observe for cervical change  IV fluids and stadol IV for now. Abx and BMZ if cervical change noted.

## 2015-04-05 NOTE — Discharge Summary (Signed)
S: Pt reports contractions have decreased significantly. She is requesting to go home.  O: RN reports rare contractions at this point. VSS A: IUP at 7659w4d, resolution of preterm contractions P: Discharge home, PTL precautions, [facility]

## 2015-04-07 LAB — RPR: RPR: NONREACTIVE

## 2015-04-07 LAB — CULTURE, BETA STREP (GROUP B ONLY)

## 2015-04-08 ENCOUNTER — Ambulatory Visit
Admission: RE | Admit: 2015-04-08 | Discharge: 2015-04-08 | Disposition: A | Discharge status: Home / Self Care | Payer: Medicaid Other | Source: Ambulatory Visit | Attending: Obstetrics and Gynecology | Admitting: Obstetrics and Gynecology

## 2015-04-08 ENCOUNTER — Ambulatory Visit (HOSPITAL_BASED_OUTPATIENT_CLINIC_OR_DEPARTMENT_OTHER)
Admission: RE | Admit: 2015-04-08 | Discharge: 2015-04-08 | Disposition: A | Discharge status: Home / Self Care | Payer: Medicaid Other | Source: Ambulatory Visit | Attending: Obstetrics and Gynecology | Admitting: Obstetrics and Gynecology

## 2015-04-08 VITALS — BP 123/66 | HR 96 | Temp 98.4°F | Resp 18 | Ht 63.0 in | Wt 130.2 lb

## 2015-04-08 DIAGNOSIS — O365990 Maternal care for other known or suspected poor fetal growth, unspecified trimester, not applicable or unspecified: Secondary | ICD-10-CM | POA: Diagnosis not present

## 2015-04-08 NOTE — Progress Notes (Signed)
NSt reviewed  Baseline 135  Pos accels Reactive  Moderate variability.   See u/s report ceph AFI =14 normal dopplers BPP 8/10 -2 breathing Growth next week  Dominique Frost, Jolie Strohecker

## 2015-04-15 ENCOUNTER — Ambulatory Visit: Admission: RE | Admit: 2015-04-15 | Payer: Medicaid Other | Source: Ambulatory Visit

## 2015-04-19 ENCOUNTER — Ambulatory Visit
Admission: RE | Admit: 2015-04-19 | Discharge: 2015-04-19 | Disposition: A | Discharge status: Home / Self Care | Payer: Medicaid Other | Source: Ambulatory Visit | Attending: Maternal & Fetal Medicine | Admitting: Maternal & Fetal Medicine

## 2015-04-19 DIAGNOSIS — O365930 Maternal care for other known or suspected poor fetal growth, third trimester, not applicable or unspecified: Secondary | ICD-10-CM | POA: Diagnosis present

## 2015-04-19 DIAGNOSIS — Z3A36 36 weeks gestation of pregnancy: Secondary | ICD-10-CM | POA: Diagnosis not present

## 2015-04-19 DIAGNOSIS — O365990 Maternal care for other known or suspected poor fetal growth, unspecified trimester, not applicable or unspecified: Secondary | ICD-10-CM

## 2015-04-19 DIAGNOSIS — IMO0002 Reserved for concepts with insufficient information to code with codable children: Secondary | ICD-10-CM

## 2015-04-19 LAB — US OB FOLLOW UP

## 2015-04-21 ENCOUNTER — Inpatient Hospital Stay
Admission: EM | Admit: 2015-04-21 | Discharge: 2015-04-23 | DRG: 775 | Disposition: A | Discharge status: Home / Self Care | Payer: Medicaid Other | Attending: Obstetrics and Gynecology | Admitting: Obstetrics and Gynecology

## 2015-04-21 ENCOUNTER — Inpatient Hospital Stay: Payer: Medicaid Other | Admitting: Anesthesiology

## 2015-04-21 DIAGNOSIS — Z91410 Personal history of adult physical and sexual abuse: Secondary | ICD-10-CM

## 2015-04-21 DIAGNOSIS — F17210 Nicotine dependence, cigarettes, uncomplicated: Secondary | ICD-10-CM | POA: Diagnosis present

## 2015-04-21 DIAGNOSIS — O365990 Maternal care for other known or suspected poor fetal growth, unspecified trimester, not applicable or unspecified: Secondary | ICD-10-CM

## 2015-04-21 DIAGNOSIS — O365930 Maternal care for other known or suspected poor fetal growth, third trimester, not applicable or unspecified: Principal | ICD-10-CM | POA: Diagnosis present

## 2015-04-21 DIAGNOSIS — O99334 Smoking (tobacco) complicating childbirth: Secondary | ICD-10-CM | POA: Diagnosis present

## 2015-04-21 DIAGNOSIS — Z3A37 37 weeks gestation of pregnancy: Secondary | ICD-10-CM | POA: Diagnosis present

## 2015-04-21 DIAGNOSIS — O0933 Supervision of pregnancy with insufficient antenatal care, third trimester: Secondary | ICD-10-CM

## 2015-04-21 LAB — URINE DRUG SCREEN, QUALITATIVE (ARMC ONLY)
AMPHETAMINES, UR SCREEN: NOT DETECTED
Barbiturates, Ur Screen: NOT DETECTED
Benzodiazepine, Ur Scrn: NOT DETECTED
COCAINE METABOLITE, UR ~~LOC~~: NOT DETECTED
Cannabinoid 50 Ng, Ur ~~LOC~~: NOT DETECTED
MDMA (ECSTASY) UR SCREEN: NOT DETECTED
METHADONE SCREEN, URINE: NOT DETECTED
Opiate, Ur Screen: NOT DETECTED
Phencyclidine (PCP) Ur S: NOT DETECTED
Tricyclic, Ur Screen: NOT DETECTED

## 2015-04-21 LAB — CBC
HCT: 37.1 % (ref 35.0–47.0)
Hemoglobin: 12.5 g/dL (ref 12.0–16.0)
MCH: 31.2 pg (ref 26.0–34.0)
MCHC: 33.6 g/dL (ref 32.0–36.0)
MCV: 92.8 fL (ref 80.0–100.0)
Platelets: 191 10*3/uL (ref 150–440)
RBC: 4 MIL/uL (ref 3.80–5.20)
RDW: 12.6 % (ref 11.5–14.5)
WBC: 14.1 10*3/uL — ABNORMAL HIGH (ref 3.6–11.0)

## 2015-04-21 LAB — CORD BLOOD GAS (ARTERIAL)
Acid-base deficit: 1.5 mmol/L (ref 0.0–2.0)
BICARBONATE: 23.7 meq/L (ref 21.0–28.0)
pCO2 cord blood (arterial): 41 mmHg — ABNORMAL LOW (ref 42.0–56.0)
pH cord blood (arterial): 7.37 (ref 7.210–7.380)

## 2015-04-21 LAB — OB RESULTS CONSOLE ABO/RH: RH Type: NEGATIVE

## 2015-04-21 LAB — TYPE AND SCREEN
ABO/RH(D): O NEG
ANTIBODY SCREEN: POSITIVE

## 2015-04-21 LAB — OB RESULTS CONSOLE ANTIBODY SCREEN: Antibody Screen: NEGATIVE

## 2015-04-21 LAB — ABO/RH: ABO/RH(D): O NEG

## 2015-04-21 LAB — OB RESULTS CONSOLE HIV ANTIBODY (ROUTINE TESTING): HIV: NONREACTIVE

## 2015-04-21 LAB — OB RESULTS CONSOLE GBS: GBS: NEGATIVE

## 2015-04-21 LAB — OB RESULTS CONSOLE GC/CHLAMYDIA
Chlamydia: NEGATIVE
Gonorrhea: NEGATIVE

## 2015-04-21 MED ORDER — IBUPROFEN 800 MG PO TABS
800.0000 mg | ORAL_TABLET | Freq: Three times a day (TID) | ORAL | Status: DC
  Administered 2015-04-21 – 2015-04-23 (×7): 800 mg via ORAL
  Filled 2015-04-21 (×6): qty 1

## 2015-04-21 MED ORDER — OXYTOCIN 40 UNITS IN LACTATED RINGERS INFUSION - SIMPLE MED
1.0000 m[IU]/min | INTRAVENOUS | Status: DC
  Administered 2015-04-21: 1 m[IU]/min via INTRAVENOUS

## 2015-04-21 MED ORDER — ACETAMINOPHEN 325 MG PO TABS
650.0000 mg | ORAL_TABLET | Freq: Four times a day (QID) | ORAL | Status: DC | PRN
  Administered 2015-04-21: 650 mg via ORAL

## 2015-04-21 MED ORDER — OXYTOCIN BOLUS FROM INFUSION: 500.0000 mL | INTRAVENOUS | Status: DC

## 2015-04-21 MED ORDER — DOCUSATE SODIUM 100 MG PO CAPS
100.0000 mg | ORAL_CAPSULE | Freq: Two times a day (BID) | ORAL | Status: DC
  Administered 2015-04-22 – 2015-04-23 (×4): 100 mg via ORAL
  Filled 2015-04-21 (×4): qty 1

## 2015-04-21 MED ORDER — OXYCODONE-ACETAMINOPHEN 5-325 MG PO TABS: 1.0000 | ORAL_TABLET | ORAL | Status: DC | PRN

## 2015-04-21 MED ORDER — EPHEDRINE 5 MG/ML INJ
10.0000 mg | INTRAVENOUS | Status: DC | PRN
  Filled 2015-04-21: qty 2

## 2015-04-21 MED ORDER — DIPHENHYDRAMINE HCL 50 MG/ML IJ SOLN: 12.5000 mg | INTRAMUSCULAR | Status: DC | PRN

## 2015-04-21 MED ORDER — LIDOCAINE-EPINEPHRINE (PF) 1.5 %-1:200000 IJ SOLN
INTRAMUSCULAR | Status: DC | PRN
  Administered 2015-04-21: 3 mL via EPIDURAL

## 2015-04-21 MED ORDER — PHENYLEPHRINE 40 MCG/ML (10ML) SYRINGE FOR IV PUSH (FOR BLOOD PRESSURE SUPPORT)
80.0000 ug | PREFILLED_SYRINGE | INTRAVENOUS | Status: DC | PRN
  Filled 2015-04-21: qty 2

## 2015-04-21 MED ORDER — ONDANSETRON HCL 4 MG PO TABS
4.0000 mg | ORAL_TABLET | ORAL | Status: DC | PRN
  Administered 2015-04-22: 4 mg via ORAL
  Filled 2015-04-21: qty 1

## 2015-04-21 MED ORDER — OXYTOCIN 40 UNITS IN LACTATED RINGERS INFUSION - SIMPLE MED
INTRAVENOUS | Status: AC
  Administered 2015-04-21: 1 m[IU]/min via INTRAVENOUS
  Filled 2015-04-21: qty 1000

## 2015-04-21 MED ORDER — FERROUS SULFATE 325 (65 FE) MG PO TABS
325.0000 mg | ORAL_TABLET | Freq: Two times a day (BID) | ORAL | Status: DC
  Administered 2015-04-22 – 2015-04-23 (×4): 325 mg via ORAL
  Filled 2015-04-21 (×4): qty 1

## 2015-04-21 MED ORDER — ACETAMINOPHEN 325 MG PO TABS
ORAL_TABLET | ORAL | Status: AC
  Administered 2015-04-21: 650 mg via ORAL
  Filled 2015-04-21: qty 2

## 2015-04-21 MED ORDER — FENTANYL 2.5 MCG/ML W/ROPIVACAINE 0.2% IN NS 100 ML EPIDURAL INFUSION (ARMC-ANES)
EPIDURAL | Status: AC
  Administered 2015-04-21: 9 mL/h via EPIDURAL
  Filled 2015-04-21: qty 100

## 2015-04-21 MED ORDER — ONDANSETRON HCL 4 MG/2ML IJ SOLN
INTRAMUSCULAR | Status: AC
  Administered 2015-04-21: 4 mg via INTRAVENOUS
  Filled 2015-04-21: qty 2

## 2015-04-21 MED ORDER — BUPIVACAINE HCL (PF) 0.25 % IJ SOLN
INTRAMUSCULAR | Status: DC | PRN
Start: 2015-04-21 — End: 2015-04-22
  Administered 2015-04-21: 5 mL

## 2015-04-21 MED ORDER — LACTATED RINGERS IV SOLN
500.0000 mL | INTRAVENOUS | Status: DC | PRN
  Administered 2015-04-21: 250 mL via INTRAVENOUS
  Administered 2015-04-21: 500 mL via INTRAVENOUS

## 2015-04-21 MED ORDER — LIDOCAINE HCL (PF) 1 % IJ SOLN: 30.0000 mL | INTRAMUSCULAR | Status: DC | PRN

## 2015-04-21 MED ORDER — CLONAZEPAM 0.5 MG PO TABS
0.5000 mg | ORAL_TABLET | Freq: Two times a day (BID) | ORAL | Status: DC
  Administered 2015-04-21 – 2015-04-23 (×6): 0.5 mg via ORAL
  Filled 2015-04-21 (×5): qty 1

## 2015-04-21 MED ORDER — OXYCODONE-ACETAMINOPHEN 5-325 MG PO TABS
2.0000 | ORAL_TABLET | ORAL | Status: DC | PRN
  Administered 2015-04-22 – 2015-04-23 (×12): 2 via ORAL
  Filled 2015-04-21 (×12): qty 2

## 2015-04-21 MED ORDER — WITCH HAZEL-GLYCERIN EX PADS: 1.0000 "application " | MEDICATED_PAD | CUTANEOUS | Status: DC | PRN

## 2015-04-21 MED ORDER — DIBUCAINE 1 % RE OINT: 1.0000 "application " | TOPICAL_OINTMENT | RECTAL | Status: DC | PRN

## 2015-04-21 MED ORDER — IBUPROFEN 800 MG PO TABS
ORAL_TABLET | ORAL | Status: AC
  Administered 2015-04-21: 800 mg via ORAL
  Filled 2015-04-21: qty 1

## 2015-04-21 MED ORDER — DIPHENHYDRAMINE HCL 25 MG PO CAPS: 25.0000 mg | ORAL_CAPSULE | Freq: Four times a day (QID) | ORAL | Status: DC | PRN

## 2015-04-21 MED ORDER — LACTATED RINGERS IV SOLN
INTRAVENOUS | Status: DC
  Administered 2015-04-21: 07:00:00 via INTRAVENOUS

## 2015-04-21 MED ORDER — TETANUS-DIPHTH-ACELL PERTUSSIS 5-2.5-18.5 LF-MCG/0.5 IM SUSP: 0.5000 mL | INTRAMUSCULAR | Status: DC | PRN

## 2015-04-21 MED ORDER — CITRIC ACID-SODIUM CITRATE 334-500 MG/5ML PO SOLN: 30.0000 mL | ORAL | Status: DC | PRN

## 2015-04-21 MED ORDER — CLONAZEPAM 1 MG PO TABS
ORAL_TABLET | ORAL | Status: AC
  Administered 2015-04-21: 0.5 mg via ORAL
  Filled 2015-04-21: qty 1

## 2015-04-21 MED ORDER — BENZOCAINE-MENTHOL 20-0.5 % EX AERO: 1.0000 "application " | INHALATION_SPRAY | CUTANEOUS | Status: DC | PRN

## 2015-04-21 MED ORDER — FENTANYL 2.5 MCG/ML W/ROPIVACAINE 0.2% IN NS 100 ML EPIDURAL INFUSION (ARMC-ANES): 9.0000 mL/h | EPIDURAL | Status: DC

## 2015-04-21 MED ORDER — ONDANSETRON HCL 4 MG/2ML IJ SOLN: 4.0000 mg | INTRAMUSCULAR | Status: DC | PRN

## 2015-04-21 MED ORDER — OXYTOCIN 40 UNITS IN LACTATED RINGERS INFUSION - SIMPLE MED: 62.5000 mL/h | INTRAVENOUS | Status: DC

## 2015-04-21 MED ORDER — ACETAMINOPHEN 325 MG PO TABS: 650.0000 mg | ORAL_TABLET | ORAL | Status: DC | PRN

## 2015-04-21 MED ORDER — LANOLIN HYDROUS EX OINT: TOPICAL_OINTMENT | CUTANEOUS | Status: DC | PRN

## 2015-04-21 MED ORDER — MEASLES, MUMPS & RUBELLA VAC ~~LOC~~ INJ
0.5000 mL | INJECTION | Freq: Once | SUBCUTANEOUS | Status: DC
  Filled 2015-04-21: qty 0.5

## 2015-04-21 MED ORDER — ONDANSETRON HCL 4 MG/2ML IJ SOLN
4.0000 mg | Freq: Four times a day (QID) | INTRAMUSCULAR | Status: DC | PRN
  Administered 2015-04-21: 4 mg via INTRAVENOUS

## 2015-04-21 MED ORDER — TERBUTALINE SULFATE 1 MG/ML IJ SOLN: 0.2500 mg | Freq: Once | INTRAMUSCULAR | Status: DC | PRN

## 2015-04-21 MED ORDER — SIMETHICONE 80 MG PO CHEW: 80.0000 mg | CHEWABLE_TABLET | ORAL | Status: DC | PRN

## 2015-04-21 MED ORDER — PRENATAL MULTIVITAMIN CH
1.0000 | ORAL_TABLET | Freq: Every day | ORAL | Status: DC
  Administered 2015-04-22 – 2015-04-23 (×2): 1 via ORAL
  Filled 2015-04-21 (×2): qty 1

## 2015-04-21 NOTE — Progress Notes (Signed)
Dominique Frost is a 24 y.o. G4P2010 at 5477w0d by ultrasound admitted for induction of labor due to Poor fetal growth.  Subjective: Labor induction well tolerated  Objective: BP 106/59 mmHg  Pulse 68  Temp(Src) 98.1 F (36.7 C) (Oral)  Resp 18  Ht 5\' 3"  (1.6 m)  Wt 129 lb (58.514 kg)  BMI 22.86 kg/m2  LMP 08/05/2014 (Approximate)      FHT:  FHR: 140 bpm, variability: moderate,  accelerations:  Present,  decelerations:  Absent UC:   regular, every 2-3 minutes SVE:   Dilation: 2.5 Effacement (%): 70 Station: -2 Exam by:: Dominique Frost  Labs: Lab Results  Component Value Date   WBC 14.1* 04/21/2015   HGB 12.5 04/21/2015   HCT 37.1 04/21/2015   MCV 92.8 04/21/2015   PLT 191 04/21/2015    Assessment / Plan: Induction of labor due to IUGR,  progressing well on pitocin  Labor: ongoing Fetal Wellbeing:  Category I Pain Control:   I/D:  epidural pending Anticipated MOD:  NSVD  Daphine DeutscherMartin A Danette Weinfeld 04/21/2015, 12:55 PM

## 2015-04-21 NOTE — Anesthesia Procedure Notes (Signed)
Epidural  Start time: 04/21/2015 1:30 PM  Staffing Resident/CRNA: Jordanna Hendrie Performed by: resident/CRNA   Preanesthetic Checklist Completed: patient identified, surgical consent, pre-op evaluation, timeout performed, IV checked, risks and benefits discussed and monitors and equipment checked  Epidural Patient position: sitting Prep: Betadine Patient monitoring: heart rate, continuous pulse ox and blood pressure Approach: midline Location: L3-L4 Injection technique: LOR saline  Needle:  Needle type: Tuohy  Needle gauge: 18 G Needle length: 9 cm Needle insertion depth: 5 cm Catheter type: closed end flexible Catheter size: 20 Guage Catheter at skin depth: 10 cm Test dose: negative and 1.5% lidocaine with Epi 1:200 K  Assessment Events: blood not aspirated, injection not painful, no injection resistance, negative IV test and no paresthesia  Additional Notes Reason for block:procedure for pain
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
## 2015-04-21 NOTE — Anesthesia Preprocedure Evaluation (Signed)
Anesthesia Evaluation  Patient identified by MRN, date of birth, ID band Patient awake    Reviewed: Allergy & Precautions, H&P , Patient's Chart, lab work & pertinent test results  History of Anesthesia Complications Negative for: history of anesthetic complications  Airway Mallampati: II       Dental no notable dental hx.    Pulmonary Current Smoker,    Pulmonary exam normal       Cardiovascular negative cardio ROS Normal cardiovascular exam    Neuro/Psych    GI/Hepatic negative GI ROS, Neg liver ROS,   Endo/Other  negative endocrine ROS  Renal/GU negative Renal ROS     Musculoskeletal   Abdominal   Peds  Hematology negative hematology ROS (+)   Anesthesia Other Findings   Reproductive/Obstetrics (+) Pregnancy                             Anesthesia Physical Anesthesia Plan  ASA: II  Anesthesia Plan: Epidural   Post-op Pain Management:    Induction:   Airway Management Planned:   Additional Equipment:   Intra-op Plan:   Post-operative Plan:   Informed Consent: I have reviewed the patients History and Physical, chart, labs and discussed the procedure including the risks, benefits and alternatives for the proposed anesthesia with the patient or authorized representative who has indicated his/her understanding and acceptance.     Plan Discussed with: Anesthesiologist  Anesthesia Plan Comments:         Anesthesia Quick Evaluation

## 2015-04-22 ENCOUNTER — Encounter: Payer: Self-pay | Admitting: Obstetrics and Gynecology

## 2015-04-22 ENCOUNTER — Other Ambulatory Visit: Payer: Medicaid Other

## 2015-04-22 LAB — CBC
HCT: 34.4 % — ABNORMAL LOW (ref 35.0–47.0)
Hemoglobin: 11.5 g/dL — ABNORMAL LOW (ref 12.0–16.0)
MCH: 31.2 pg (ref 26.0–34.0)
MCHC: 33.5 g/dL (ref 32.0–36.0)
MCV: 93 fL (ref 80.0–100.0)
Platelets: 165 10*3/uL (ref 150–440)
RBC: 3.7 MIL/uL — ABNORMAL LOW (ref 3.80–5.20)
RDW: 12.8 % (ref 11.5–14.5)
WBC: 15.8 10*3/uL — AB (ref 3.6–11.0)

## 2015-04-22 LAB — FETAL SCREEN: FETAL SCREEN: NEGATIVE

## 2015-04-22 LAB — RPR: RPR: NONREACTIVE

## 2015-04-22 MED ORDER — RHO D IMMUNE GLOBULIN 1500 UNIT/2ML IJ SOSY
300.0000 ug | PREFILLED_SYRINGE | Freq: Once | INTRAMUSCULAR | Status: AC
  Administered 2015-04-22: 300 ug via INTRAVENOUS
  Filled 2015-04-22: qty 2

## 2015-04-22 MED ORDER — RHO D IMMUNE GLOBULIN 1500 UNIT/2ML IJ SOSY
300.0000 ug | PREFILLED_SYRINGE | Freq: Once | INTRAMUSCULAR | Status: DC
  Filled 2015-04-22: qty 2

## 2015-04-22 NOTE — Anesthesia Postprocedure Evaluation (Signed)
  Anesthesia Post-op Note  Patient: Dominique Frost  Procedure(s) Performed: * No procedures listed *  Anesthesia type:Epidural  Patient location: 342a  Post pain: Pain level controlled  Post assessment: Post-op Vital signs reviewed, Patient's Cardiovascular Status Stable, Respiratory Function Stable, Patent Airway and No signs of Nausea or vomiting  Post vital signs: Reviewed and stable  Last Vitals:  Filed Vitals:   04/22/15 0758  BP: 104/68  Pulse: 55  Temp: 36.5 C  Resp: 18    Level of consciousness: awake, alert  and patient cooperative  Complications: No apparent anesthesia complications

## 2015-04-22 NOTE — Clinical Social Work Maternal (Signed)
CLINICAL SOCIAL WORK MATERNAL/CHILD NOTE  Patient Details  Name: Dominique Frost MRN: [id number] Date of Birth: 01/12/1991  Date: 04/22/2015  Clinical Social Worker Initiating Note: Dominique Frost, LCSWDate/ Time Initiated: 04/22/15/1339   Child's Name:  (Dominique Frost)   Legal Guardian: Mother   Need for Interpreter: None   Date of Referral: 04/22/15   Reason for Referral: Parental Support of Premature Babies < 32 weeks/of Critically Ill babies    Referral Source: RN   Address: [address], [place]  Phone number: [phone]   Household Members: Self, Spouse   Natural Supports (not living in the home): Extended Family, Immediate Family, Parent   Professional Supports:Other (Comment) (Dr. Nehemiar [phone])   Employment:Unemployed   Type of Work: n/a   Education: Other (comment) (GED)   Financial Resources:Medicaid, Other(Comment) (Husband works in construction)   Other Resources:     Cultural/Religious Considerations Which May Impact Care: n/a  Strengths: Ability to meet basic needs , Home prepared for child , Understanding of illness   Risk Factors/Current Problems: Adjustment to Illness , Mental Health Concerns    Cognitive State: Alert , Goal Oriented    Mood/Affect: Apprehensive , Tearful  (initially slightly guarded)   CSW Assessment:CSW met with pt and her husband Dominique Frost. Pt was initially guarded and questioned why CSW needed to meet with her. CSW explained that she was not from DSS but an employee of [employer] who was simply there to provide support and see if pt had any needs. Pt's mother and 4 y/o daughter were initially at bedside. CSW asked them to step out of the room while she met with pt. Pt stated she and husband have been together for 2 years and married for approximately 1 year. Pt and husband explained that their families are supportive and all of the basic needs for  the baby have been met.   Pt explained that her 2 children (2 y/o son and 4 y/o daughter) live with her father and have been doing so since the oldest child was 1. Pt became tearful as CSW probed more about why her children were in her father's care. Pt stated she had the children when she was young and less stable. Pt would not offer additional information about the custody of her children. Pt seems hopeful about caring for the new baby. Pt's husband explained that he helps out with the other children since the other fathers are not around. Pt's husband consoled her and appeared very supportive.   Pt denies any substance use. Pt explained that she takes medication for depression and is concerned about postpartum depression. CSW offered to provide pt with mental health resources of which she was not interested. She plans to schedule an appt with Dr. Nehemiar who prescribes her depression medication. CSW shared pt's concerns with her nurse.   Pt appeared anxious and expressed that she is concerned about her baby being premature and being in the SCN. CSW validated the concerns and encouraged both parents to take it one day at a time. CSW encouraged family to contact CSW should they need further support as they care for their child in the SCN. CSW will be available should pt need ongoing support.   CSW Plan/Description: Psychosocial Support and Ongoing Assessment of Needs   Dominique Morry Veiga, LCSW [phone] Dominique Cumpian D, LCSW 04/22/2015, 2:03 PM                pt need ongoing support.    CSW Plan/Description:  Psychosocial Support and Ongoing Assessment of Needs   Dominique Frost, [place] Naida Sleight, [place] 04/22/2015, 2:03 PM

## 2015-04-22 NOTE — Progress Notes (Signed)
Post Partum Day 1 Subjective: no complaints, up ad lib, voiding and tolerating PO  Objective: Blood pressure 104/68, pulse 55, temperature 97.7 F (36.5 C), temperature source Oral, resp. rate 18, height  (1.6 m), weight 129 lb (58.514 kg), last menstrual period 08/05/2014, SpO2 99 %, unknown if currently breastfeeding.  Physical Exam:  General: alert and cooperative Lochia: appropriate Uterine Fundus: firm Incision: none DVT Evaluation: No evidence of DVT seen on physical exam.   Recent Labs  04/21/15 0801 04/22/15 0540  HGB 12.5 11.5*  HCT 37.1 34.4*    Assessment/Plan: Plan for discharge tomorrow   LOS: 1 day   Prentice Docker Sheril Hammond 04/22/2015, 10:34 AM

## 2015-04-23 DIAGNOSIS — O365990 Maternal care for other known or suspected poor fetal growth, unspecified trimester, not applicable or unspecified: Secondary | ICD-10-CM

## 2015-04-23 LAB — RHOGAM INJECTION: Unit division: 0

## 2015-04-23 MED ORDER — OXYCODONE-ACETAMINOPHEN 5-325 MG PO TABS: 2.0000 | ORAL_TABLET | ORAL | Status: DC | PRN

## 2015-04-23 MED ORDER — IBUPROFEN 800 MG PO TABS: 800.0000 mg | ORAL_TABLET | Freq: Three times a day (TID) | ORAL | Status: DC

## 2015-04-23 NOTE — Progress Notes (Signed)
Discharge teaching completed.

## 2015-04-23 NOTE — Discharge Summary (Signed)
Obstetric Discharge Summary Reason for Admission: induction of labor Prenatal Procedures: NST, ultrasound and BPP Intrapartum Procedures: spontaneous vaginal delivery Postpartum Procedures: none Complications-Operative and Postpartum: none HEMOGLOBIN  Date Value Ref Range Status  04/22/2015 11.5* 12.0 - 16.0 g/dL Final   HGB  Date Value Ref Range Status  09/24/2014 13.3 12.0-16.0 g/dL Final   HCT  Date Value Ref Range Status  04/22/2015 34.4* 35.0 - 47.0 % Final  09/24/2014 39.6 35.0-47.0 % Final    Physical Exam:  General: alert and cooperative Lochia: appropriate Uterine Fundus: firm Incision: none DVT Evaluation: No evidence of DVT seen on physical exam.  Discharge Diagnoses: 37.0 week IUP, Delivered; s/p IOL for IUGR; Viable Female 4#9  Discharge Information: Date: 04/23/2015 Activity: unrestricted Diet: routine Medications: PNV, Ibuprofen and Percocet Condition: stable Instructions: refer to practice specific booklet Discharge to: home Follow-up Information    Follow up with Herold Harms, MD In 6 weeks.   Specialties:  Obstetrics and Gynecology, Radiology   Why:  6 week Post Partum Check   Contact information:   [address] [place] [address] [phone]       Newborn Data: Live born female  Birth Weight: 4 lb 5 oz (1955 g) APGAR: 6, 9  Home with out infant (remains in Special care nursery for observation).  Dominique Frost A Dominique Frost 04/23/2015, 8:08 AM

## 2015-04-23 NOTE — Progress Notes (Signed)
Discharged home. Via w/c

## 2015-04-27 LAB — SURGICAL PATHOLOGY

## 2015-05-14 ENCOUNTER — Encounter: Payer: Self-pay | Admitting: *Deleted

## 2015-05-14 ENCOUNTER — Emergency Department
Admission: EM | Admit: 2015-05-14 | Discharge: 2015-05-15 | Discharge status: Against Medical Advice | Payer: Medicaid Other | Attending: Emergency Medicine | Admitting: Emergency Medicine

## 2015-05-14 DIAGNOSIS — F17210 Nicotine dependence, cigarettes, uncomplicated: Secondary | ICD-10-CM | POA: Diagnosis not present

## 2015-05-14 DIAGNOSIS — O99335 Smoking (tobacco) complicating the puerperium: Secondary | ICD-10-CM | POA: Diagnosis not present

## 2015-05-14 LAB — URINALYSIS COMPLETE WITH MICROSCOPIC (ARMC ONLY)
BILIRUBIN URINE: NEGATIVE
Bacteria, UA: NONE SEEN
Glucose, UA: NEGATIVE mg/dL
KETONES UR: NEGATIVE mg/dL
Nitrite: NEGATIVE
PH: 5 (ref 5.0–8.0)
Protein, ur: 30 mg/dL — AB
SPECIFIC GRAVITY, URINE: 1.031 — AB (ref 1.005–1.030)

## 2015-05-14 LAB — LIPASE, BLOOD: LIPASE: 26 U/L (ref 22–51)

## 2015-05-14 LAB — CBC
HEMATOCRIT: 41.8 % (ref 35.0–47.0)
HEMOGLOBIN: 14.2 g/dL (ref 12.0–16.0)
MCH: 31.1 pg (ref 26.0–34.0)
MCHC: 34.1 g/dL (ref 32.0–36.0)
MCV: 91.4 fL (ref 80.0–100.0)
Platelets: 345 10*3/uL (ref 150–440)
RBC: 4.57 MIL/uL (ref 3.80–5.20)
RDW: 13.1 % (ref 11.5–14.5)
WBC: 12 10*3/uL — ABNORMAL HIGH (ref 3.6–11.0)

## 2015-05-14 LAB — COMPREHENSIVE METABOLIC PANEL
ALT: 16 U/L (ref 14–54)
ANION GAP: 8 (ref 5–15)
AST: 19 U/L (ref 15–41)
Albumin: 4.4 g/dL (ref 3.5–5.0)
Alkaline Phosphatase: 48 U/L (ref 38–126)
BUN: 12 mg/dL (ref 6–20)
CO2: 21 mmol/L — ABNORMAL LOW (ref 22–32)
CREATININE: 0.74 mg/dL (ref 0.44–1.00)
Calcium: 9.1 mg/dL (ref 8.9–10.3)
Chloride: 109 mmol/L (ref 101–111)
GFR calc non Af Amer: >60 mL/min (ref >60)
Glucose, Bld: 90 mg/dL (ref 65–99)
Potassium: 3.4 mmol/L — ABNORMAL LOW (ref 3.5–5.1)
Sodium: 138 mmol/L (ref 135–145)
TOTAL PROTEIN: 7.6 g/dL (ref 6.5–8.1)
Total Bilirubin: 0.5 mg/dL (ref 0.3–1.2)

## 2015-05-14 NOTE — ED Notes (Addendum)
Vag bleeding soince having baby on 07/22. Pt has had depo shot. Pt states yesterday started with left sided flank pain. Pt afraid she has a kidney problem. Pt also complains of lower abd pain

## 2015-06-02 ENCOUNTER — Ambulatory Visit: Payer: Medicaid Other | Admitting: Obstetrics and Gynecology

## 2015-06-10 ENCOUNTER — Ambulatory Visit (INDEPENDENT_AMBULATORY_CARE_PROVIDER_SITE_OTHER): Payer: Medicaid Other | Admitting: Obstetrics and Gynecology

## 2015-06-10 ENCOUNTER — Encounter: Payer: Self-pay | Admitting: Obstetrics and Gynecology

## 2015-06-10 DIAGNOSIS — Z3042 Encounter for surveillance of injectable contraceptive: Secondary | ICD-10-CM

## 2015-06-10 NOTE — Progress Notes (Signed)
Patient ID: Dominique Frost, female   DOB: Jul 28, 1991, 24 y.o.   MRN: [id number] 6 week pp svd - 04/21/2015 Female 4.5oz Bottle feeding Seen in er 9/2 dx with bv and uti Treated with flagyl and bactrium-  Depo inj at [facility]- 04/28/2015- will continue there phq -10 - on zoloft  qd- helps may want something else  Chief complaint: 1.  Postpartum check.-6 weeks.  Patient presents for her 6 week postpartum visit following these vaginal delivery after induction of labor for IUGR.  She delivered a 4 lbs. 5 oz. Baby girl, who is thriving.  At this time.  She did get Depo-Provera in the hospital for perception.  Patient has had increasing her Zoloft because of postpartum blues; she has been on Zoloft for depression along with clonazepam.  She is followed at [facility] for her depression. Patient is complaining of thrush after having been given antibiotics for UTIs.  Past Medical History  Diagnosis Date  . Depression   . PTSD (post-traumatic stress disorder)   . Borderline personality disorder   . Generalized anxiety disorder    Past Surgical History  Procedure Laterality Date  . Dilation and curettage of uterus     OBJECTIVE:. BP 104/76 mmHg  Pulse 103  Ht  (1.6 m)  Wt 118 lb 8 oz (53.751 kg)  BMI 21.00 kg/m2  Breastfeeding? No Pleasant, well-appearing white female in no acute distress.  Affect is appropriate. Neck: Supple without thyromegaly.  Retinopathy. Oropharynx: White plaque on tongue. Breasts: Soft, nontender, without dominant masses, adenopathy or nipple discharge. Abdomen: Soft, nontender, without organomegaly. Pelvic exam: External genitalia-normal. BUS-normal Vagina-normal Cervix-no lesions; no cervical motion tenderness. Uterus-midplane, normal size and shape, nontender Adnexa-nonpalpable and nontender. External rectal exam-normal. Skin: Multiple tattoos. Musculoskeletal: Normal.  IMPRESSION: 1.  Normal 6 week postpartum check. 2.   Contraception-Depo-Provera every 3 months. 3.  Postpartum blues/history of chronic depression, on Zoloft 150 mg daily.  PLAN: 1.  Resume all activities without restriction. 2.  Follow-up with [facility] regarding depression management. 3.  Depo-Provera 150 mg IM every 3 months.  Herold Harms, MD

## 2015-06-10 NOTE — Patient Instructions (Signed)
1.  Resume activities without restriction. 2.  Continue with Zoloft 150 mg daily; follow-up with [facility] for counseling. 3.  Depo-Provera 150 mg IM every 3 months.

## 2015-06-11 ENCOUNTER — Telehealth: Payer: Self-pay | Admitting: Obstetrics and Gynecology

## 2015-06-11 MED ORDER — FLUCONAZOLE 150 MG PO TABS: 150.0000 mg | ORAL_TABLET | Freq: Every day | ORAL | Status: DC

## 2015-06-11 NOTE — Telephone Encounter (Signed)
PT WAS HERE YESTERDAY AND DR DE WAS SUPPOSED TO SEND DIFLUCAN TO [facility]. SHE CALLED AND IT WASN'T THERE.

## 2015-06-11 NOTE — Telephone Encounter (Signed)
Pt aware diflucan erx to [facility].

## 2015-06-11 NOTE — Telephone Encounter (Signed)
T CALLED BACK AND WANTS TO KNOW WHEN YOU HAVE SENT THAT RX TO PHARMACY

## 2016-03-17 ENCOUNTER — Emergency Department
Admission: EM | Admit: 2016-03-17 | Discharge: 2016-03-17 | Disposition: A | Discharge status: Home / Self Care | Payer: Medicaid Other | Attending: Emergency Medicine | Admitting: Emergency Medicine

## 2016-03-17 ENCOUNTER — Encounter: Payer: Self-pay | Admitting: Emergency Medicine

## 2016-03-17 DIAGNOSIS — F329 Major depressive disorder, single episode, unspecified: Secondary | ICD-10-CM | POA: Diagnosis not present

## 2016-03-17 DIAGNOSIS — Z791 Long term (current) use of non-steroidal anti-inflammatories (NSAID): Secondary | ICD-10-CM | POA: Diagnosis not present

## 2016-03-17 DIAGNOSIS — Z79899 Other long term (current) drug therapy: Secondary | ICD-10-CM | POA: Insufficient documentation

## 2016-03-17 DIAGNOSIS — K529 Noninfective gastroenteritis and colitis, unspecified: Secondary | ICD-10-CM | POA: Diagnosis not present

## 2016-03-17 DIAGNOSIS — R197 Diarrhea, unspecified: Secondary | ICD-10-CM | POA: Diagnosis present

## 2016-03-17 DIAGNOSIS — F419 Anxiety disorder, unspecified: Secondary | ICD-10-CM | POA: Diagnosis not present

## 2016-03-17 MED ORDER — ONDANSETRON 4 MG PO TBDP
4.0000 mg | ORAL_TABLET | Freq: Once | ORAL | Status: AC
Start: 2016-03-17 — End: 2016-03-17
  Administered 2016-03-17: 4 mg via ORAL
  Filled 2016-03-17: qty 1

## 2016-03-17 MED ORDER — ONDANSETRON HCL 4 MG PO TABS: 4.0000 mg | ORAL_TABLET | Freq: Every day | ORAL | Status: DC | PRN

## 2016-03-17 NOTE — ED Notes (Signed)
Pt states headache, congestion, aching for 2 days now.

## 2016-03-17 NOTE — Discharge Instructions (Signed)
Continue Zofran as needed for nausea. Would recommend a bland diet until symptoms have resolved. Drink plenty of fluids such as Gatorade. Return to the emergency room for any worsening symptoms.

## 2016-03-17 NOTE — ED Notes (Signed)
Patient states the [facility] gave her the crisis number and encouraged her to go to a support group.

## 2016-03-17 NOTE — ED Provider Notes (Signed)
[facility] Emergency Department Provider Note  ____________________________________________  Time seen: Approximately 6:04 PM  I have reviewed the triage vital signs and the nursing notes.   HISTORY  Chief Complaint Headache and Nasal Congestion    HPI Dominique Frost is a 25 y.o. female percents with several concerns. Has had nausea with dry heaves today. Also several episodes of diarrhea. Has felt congested with mild headache. Also complains of chronic back and leg pain. Also complains of chronic anxiety disorder and takes Klonopin daily according to the patient.   Past Medical History  Diagnosis Date  . Depression   . PTSD (post-traumatic stress disorder)   . Borderline personality disorder   . Generalized anxiety disorder     Patient Active Problem List   Diagnosis Date Noted  . SVD (spontaneous vaginal delivery) 04/21/2015  . Drug abuse, opioid type 08/05/2014  . Alcohol addiction (HCC) 03/11/2014  . Benzodiazepine dependence (HCC) 03/11/2014  . 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 Borderline personality disorder 10/30/2013  . Cannabis abuse, continuous 10/30/2013  . Neurosis, posttraumatic 10/30/2013  . Compulsive tobacco user syndrome 09/20/2012  . 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 Dilation and curettage of uterus      Current Outpatient Rx  Name  Route  Sig  Dispense  Refill  . clonazePAM (KLONOPIN) 0.5 MG tablet   Oral   Take 0.5 mg by mouth daily.         . fluconazole (DIFLUCAN) 150 MG tablet   Oral   Take 1 tablet (150 mg total) by mouth daily.   1 tablet   0   . hydrOXYzine (ATARAX/VISTARIL) 25 MG tablet   Oral   Take 25 mg by mouth 3 (three) times daily as needed for anxiety.         Marland Kitchen. ibuprofen (ADVIL,MOTRIN) 800 MG tablet   Oral   Take 1 tablet (800 mg total) by mouth 3 (three) times  daily.   30 tablet   0   . ondansetron (ZOFRAN) 4 MG tablet   Oral   Take 1 tablet (4 mg total) by mouth daily as needed for nausea or vomiting.   10 tablet   0   . sertraline (ZOLOFT) 100 MG tablet   Oral   Take 150 mg by mouth daily.           Allergies Clonidine and Vancomycin  Family History  Problem Relation Age of Onset  . Cancer Neg Hx   . Diabetes Neg Hx   . Heart disease Neg Hx     Social History Social History  Substance Use Topics  . Smoking status: Heavy Tobacco Smoker -- 0.50 packs/day for 10 years    Types: Cigarettes  . Smokeless tobacco: Never Used  . Alcohol Use: No    Review of Systems Constitutional: No fever/chills Eyes: No visual changes. ENT: No sore throat. Cardiovascular: Denies chest pain. Respiratory: Denies shortness of breath. Gastrointestinal: per HPI Musculoskeletal: Negative for back pain. Skin: Negative for rash. Neurological: Negative for headaches, focal weakness or numbness. 10-point ROS otherwise negative.  ____________________________________________   PHYSICAL EXAM:  VITAL SIGNS: ED Triage Vitals  Enc Vitals Group     BP 03/17/16 1634 123/75 mmHg     Pulse Rate 03/17/16 1634 91     Resp 03/17/16 1634 16     Temp 03/17/16 1634 98.9 F (37.2 C)  Temp Source 03/17/16 1634 Oral     SpO2 03/17/16 1634 97 %     Weight 03/17/16 1626 120 lb (54.432 kg)     Height 03/17/16 1626  (1.6 m)     Head Cir --      Peak Flow --      Pain Score 03/17/16 1626 6     Pain Loc --      Pain Edu? --      Excl. in GC? --     Constitutional: Alert and oriented. Well appearing and in no acute distress. Eyes: Conjunctivae are normal. PERRL. EOMI. Ears:  Clear with normal landmarks. No erythema. Head: Atraumatic. Nose: No congestion/rhinnorhea. Mouth/Throat: Mucous membranes are moist.  Oropharynx non-erythematous. No lesions. Neck:  Supple.  No adenopathy.   Cardiovascular: Normal rate, regular rhythm. Grossly normal  heart sounds.  Good peripheral circulation. Respiratory: Normal respiratory effort.  No retractions. Lungs CTAB. Gastrointestinal: Soft and mild general tender. No distention. No abdominal bruits Musculoskeletal: Nml ROM of upper and lower extremity joints. Neurologic:  Normal speech and language. No gross focal neurologic deficits are appreciated. No gait instability. Skin:  Skin is warm, dry and intact. No rash noted. Psychiatric: Mood and affect are normal. Speech and behavior are normal.  ____________________________________________   LABS (all labs ordered are listed, but only abnormal results are displayed)  Labs Reviewed - No data to display ____________________________________________  EKG    ____________________________________________  RADIOLOGY   ____________________________________________   PROCEDURES  Procedure(s) performed: None  Critical Care performed: No  ____________________________________________   INITIAL IMPRESSION / ASSESSMENT AND PLAN / ED COURSE  Pertinent labs & imaging results that were available during my care of the patient were reviewed by me and considered in my medical decision making (see chart for details).  25 year old who presents with symptoms of gastroenteritis. Improving. Given Zofran and by mouth fluids in the emergency room. She requested Klonopin, but I see no record of that on the drug database website. She says she obtains from [employer], but when I called the pharmacy they had no record of this. She can follow-up with her psychiatrist, Dr. Curly Shores regarding this. She is given a prescription for Zofran to take as needed. Encouraged bland diet. Vital signs are stable and exam is within normal limits except for mild abdominal discomfort. ____________________________________________   FINAL CLINICAL IMPRESSION(S) / ED DIAGNOSES  Final diagnoses:  Gastroenteritis  Anxiety      Ignacia Bayley, PA-C 03/17/16  1807  Sharman Cheek, MD 03/18/16 0010

## 2016-03-17 NOTE — ED Notes (Signed)
[facility] and speaking with the patient.
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
## 2016-04-07 ENCOUNTER — Emergency Department
Admission: EM | Admit: 2016-04-07 | Discharge: 2016-04-08 | Disposition: A | Discharge status: Home / Self Care | Payer: Self-pay | Attending: Emergency Medicine | Admitting: Emergency Medicine

## 2016-04-07 ENCOUNTER — Emergency Department: Payer: Self-pay

## 2016-04-07 ENCOUNTER — Encounter: Payer: Self-pay | Admitting: Urgent Care

## 2016-04-07 DIAGNOSIS — R1011 Right upper quadrant pain: Secondary | ICD-10-CM | POA: Insufficient documentation

## 2016-04-07 DIAGNOSIS — R1010 Upper abdominal pain, unspecified: Secondary | ICD-10-CM

## 2016-04-07 DIAGNOSIS — Z79899 Other long term (current) drug therapy: Secondary | ICD-10-CM | POA: Insufficient documentation

## 2016-04-07 DIAGNOSIS — Z8659 Personal history of other mental and behavioral disorders: Secondary | ICD-10-CM | POA: Insufficient documentation

## 2016-04-07 DIAGNOSIS — R197 Diarrhea, unspecified: Secondary | ICD-10-CM | POA: Insufficient documentation

## 2016-04-07 DIAGNOSIS — F329 Major depressive disorder, single episode, unspecified: Secondary | ICD-10-CM | POA: Insufficient documentation

## 2016-04-07 DIAGNOSIS — R1013 Epigastric pain: Secondary | ICD-10-CM | POA: Insufficient documentation

## 2016-04-07 DIAGNOSIS — F17210 Nicotine dependence, cigarettes, uncomplicated: Secondary | ICD-10-CM | POA: Insufficient documentation

## 2016-04-07 LAB — URINALYSIS COMPLETE WITH MICROSCOPIC (ARMC ONLY)
Bilirubin Urine: NEGATIVE
GLUCOSE, UA: NEGATIVE mg/dL
Ketones, ur: NEGATIVE mg/dL
Nitrite: NEGATIVE
Protein, ur: NEGATIVE mg/dL
Specific Gravity, Urine: 1.011 (ref 1.005–1.030)
pH: 9 — ABNORMAL HIGH (ref 5.0–8.0)

## 2016-04-07 LAB — RAPID HIV SCREEN (HIV 1/2 AB+AG)
HIV 1/2 Antibodies: NONREACTIVE
HIV-1 P24 Antigen - HIV24: NONREACTIVE

## 2016-04-07 LAB — COMPREHENSIVE METABOLIC PANEL
ALT: 16 U/L (ref 14–54)
AST: 15 U/L (ref 15–41)
Albumin: 5.1 g/dL — ABNORMAL HIGH (ref 3.5–5.0)
Alkaline Phosphatase: 37 U/L — ABNORMAL LOW (ref 38–126)
Anion gap: 6 (ref 5–15)
BILIRUBIN TOTAL: 0.8 mg/dL (ref 0.3–1.2)
BUN: 14 mg/dL (ref 6–20)
CO2: 27 mmol/L (ref 22–32)
CREATININE: 0.74 mg/dL (ref 0.44–1.00)
Calcium: 10 mg/dL (ref 8.9–10.3)
Chloride: 106 mmol/L (ref 101–111)
GFR calc Af Amer: >60 mL/min (ref >60)
GLUCOSE: 96 mg/dL (ref 65–99)
Potassium: 3.9 mmol/L (ref 3.5–5.1)
Sodium: 139 mmol/L (ref 135–145)
TOTAL PROTEIN: 8.1 g/dL (ref 6.5–8.1)

## 2016-04-07 LAB — CBC
HEMATOCRIT: 43.4 % (ref 35.0–47.0)
Hemoglobin: 15.2 g/dL (ref 12.0–16.0)
MCH: 32.2 pg (ref 26.0–34.0)
MCHC: 35 g/dL (ref 32.0–36.0)
MCV: 92 fL (ref 80.0–100.0)
Platelets: 258 10*3/uL (ref 150–440)
RBC: 4.72 MIL/uL (ref 3.80–5.20)
RDW: 12.8 % (ref 11.5–14.5)
WBC: 10.8 10*3/uL (ref 3.6–11.0)

## 2016-04-07 LAB — TSH: TSH: 0.79 u[IU]/mL (ref 0.350–4.500)

## 2016-04-07 LAB — POC URINE PREG, ED: PREG TEST UR: NEGATIVE

## 2016-04-07 LAB — LIPASE, BLOOD: LIPASE: 39 U/L (ref 11–51)

## 2016-04-07 MED ORDER — PANTOPRAZOLE SODIUM 40 MG PO TBEC: 40.0000 mg | DELAYED_RELEASE_TABLET | Freq: Every day | ORAL | Status: DC

## 2016-04-07 MED ORDER — TRAMADOL HCL 50 MG PO TABS: 50.0000 mg | ORAL_TABLET | Freq: Four times a day (QID) | ORAL | Status: DC | PRN

## 2016-04-07 MED ORDER — ONDANSETRON HCL 4 MG/2ML IJ SOLN
4.0000 mg | Freq: Once | INTRAMUSCULAR | Status: AC
  Administered 2016-04-07: 4 mg via INTRAVENOUS
  Filled 2016-04-07: qty 2

## 2016-04-07 MED ORDER — MORPHINE SULFATE (PF) 4 MG/ML IV SOLN
4.0000 mg | Freq: Once | INTRAVENOUS | Status: AC
  Administered 2016-04-07: 4 mg via INTRAVENOUS
  Filled 2016-04-07: qty 1

## 2016-04-07 MED ORDER — SODIUM CHLORIDE 0.9 % IV BOLUS (SEPSIS)
1000.0000 mL | Freq: Once | INTRAVENOUS | Status: AC
  Administered 2016-04-07: 1000 mL via INTRAVENOUS

## 2016-04-07 NOTE — ED Notes (Signed)
Pt taken to US

## 2016-04-07 NOTE — ED Provider Notes (Signed)
[facility] Emergency Department Provider Note  Time seen: 10:12 PM  I have reviewed the triage vital signs and the nursing notes.   HISTORY  Chief Complaint No chief complaint on file.    HPI Dominique Frost is a 25 y.o. female with a past medical history PTSD, depression, anxiety who presents to the emergency department with abdominal discomfort, diarrhea, nausea, vomiting. According to the patient 3 weeks ago she developed epigastric pain associated with nausea vomiting and diarrhea. She was seen in the emergency department at [facility] at that time diagnosed with a likely stomach virus. Patient states her symptoms continued to continue to have abdominal pain nausea vomiting and diarrhea so [facility] one [facility] ago was diagnosed with bacterial vaginitis. Patient states she has taken her antibiotics but continues to have epigastric pain. She is on her primary care doctor who told her she needs an ultrasound but did not order it for her. Patient states she continues to have epigastric pain so she came to the emergency department for evaluation. She states the nausea and vomiting have diminished, she continues to have several episodes of loose stool each day. Patient has not seen a GI doctor. Patient states moderate pain currently.     Past Medical History  Diagnosis Date  . Depression   . PTSD (post-traumatic stress disorder)   . Borderline personality disorder   . Generalized anxiety disorder     Patient Active Problem List   Diagnosis Date Noted  . SVD (spontaneous vaginal delivery) 04/21/2015  . Drug abuse, opioid type 08/05/2014  . Alcohol addiction (HCC) 03/11/2014  . Benzodiazepine dependence (HCC) 03/11/2014  . 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 Borderline personality disorder 10/30/2013  . Cannabis abuse, continuous 10/30/2013  . Neurosis, posttraumatic 10/30/2013  . Compulsive tobacco user syndrome 09/20/2012  .  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 Dilation and curettage of uterus      Current Outpatient Rx  Name  Route  Sig  Dispense  Refill  . clonazePAM (KLONOPIN) 0.5 MG tablet   Oral   Take 0.5 mg by mouth daily.         . fluconazole (DIFLUCAN) 150 MG tablet   Oral   Take 1 tablet (150 mg total) by mouth daily.   1 tablet   0   . hydrOXYzine (ATARAX/VISTARIL) 25 MG tablet   Oral   Take 25 mg by mouth 3 (three) times daily as needed for anxiety.         Marland Kitchen. ibuprofen (ADVIL,MOTRIN) 800 MG tablet   Oral   Take 1 tablet (800 mg total) by mouth 3 (three) times daily.   30 tablet   0   . ondansetron (ZOFRAN) 4 MG tablet   Oral   Take 1 tablet (4 mg total) by mouth daily as needed for nausea or vomiting.   10 tablet   0   . sertraline (ZOLOFT) 100 MG tablet   Oral   Take 150 mg by mouth daily.           Allergies Clonidine and Vancomycin  Family History  Problem Relation Age of Onset  . Cancer Neg Hx   . Diabetes Neg Hx   . Heart disease Neg Hx     Social History Social History  Substance Use Topics  . Smoking status: Heavy Tobacco Smoker -- 0.50 packs/day  for 10 years    Types: Cigarettes  . Smokeless tobacco: Never Used  . Alcohol Use: No    Review of Systems Constitutional: Negative for fever. Cardiovascular: Negative for chest pain. Respiratory: Negative for shortness of breath. Gastrointestinal: Positive epigastric and right upper quadrant pain. Positive for nausea. Denies any vomiting today. Positive for diarrhea for the past 3 weeks. Genitourinary: Negative for dysuria. Negative for hematuria. Musculoskeletal: Negative for back pain. Neurological: Negative for headache 10-point ROS otherwise negative.  ____________________________________________   PHYSICAL EXAM:  VITAL SIGNS: ED Triage Vitals  Enc Vitals Group     BP  04/07/16 2141 124/75 mmHg     Pulse Rate 04/07/16 2141 92     Resp 04/07/16 2141 22     Temp 04/07/16 2141 98.1 F (36.7 C)     Temp Source 04/07/16 2141 Oral     SpO2 04/07/16 2141 100 %     Weight 04/07/16 2141 120 lb (54.432 kg)     Height 04/07/16 2141 5\' 3"  (1.6 m)     Head Cir --      Peak Flow --      Pain Score --      Pain Loc --      Pain Edu? --      Excl. in GC? --     Constitutional: Alert and oriented. Well appearing and in no distress. Eyes: Normal exam ENT   Head: Normocephalic and atraumatic   Mouth/Throat: Mucous membranes are moist. Cardiovascular: Normal rate, regular rhythm. No murmur Respiratory: Normal respiratory effort without tachypnea nor retractions. Breath sounds are clear  Gastrointestinal: Soft, moderate epigastric and right upper quadrant tenderness palpation. No rebound or guarding. No distention. No lower abdominal tenderness. Musculoskeletal: Nontender with normal range of motion in all extremities. Neurologic:  Normal speech and language. No gross focal neurologic deficits  Skin:  Skin is warm, dry and intact.  Psychiatric: Mood and affect are normal.  ____________________________________________   RADIOLOGY  Upper quadrant ultrasound pending  ____________________________________________    INITIAL IMPRESSION / ASSESSMENT AND PLAN / ED COURSE  Pertinent labs & imaging results that were available during my care of the patient were reviewed by me and considered in my medical decision making (see chart for details).  The patient presents the emergency department 3 weeks of epigastric pain, nausea, vomiting, diarrhea. Patient does have moderate epigastric and right upper quadrant tenderness palpation. She has not yet received an ultrasound of the right upper quadrant. Patient is concerned she could have a gastric ulcer, discussed this with the patient that she needs to follow up with a GI doctor for an endoscopy if the ultrasound  today is normal. We will treat the patient's pain and nausea, obtained lab work, IV hydrate and obtain a right upper quadrant ultrasound. Patient is agreeable to this plan.  I have reviewed the patient's controlled substance record, she does not appear to have any narcotic prescriptions filled in 2017.  Patient's labs are largely within normal limits. Patient states she has been told in the past she has thyroid issues, she is requesting we check her thyroid hormone level. She also states she recently shot up Ritalin intravenously, patient is requesting HIV testing which I have added on to her lab work. Currently awaiting right upper quadrant ultrasound results. Patient care signed out to Dr. Zenda AlpersWebster.  ____________________________________________   FINAL CLINICAL IMPRESSION(S) / ED DIAGNOSES  Right upper quadrant abdominal pain Epigastric pain   Minna AntisKevin Arionna Hoggard, MD 04/07/16 2317

## 2016-04-07 NOTE — ED Notes (Signed)
Patient states she wants her thyroid tested due to a family history of thyroid disease and wanted an HIV test for a recent IV drug use. Patient states she injected Ritalin.

## 2016-04-07 NOTE — Discharge Instructions (Signed)

## 2016-04-07 NOTE — ED Notes (Signed)
Patient presents with a 3 week history of diffuse abdominal pain. Patient seen here x 3 weeks ago and told that she had a "stomach bug". Patient reports that she continued to worsen and [facility] and was diagnosed with BV. Additionally, she reports that she [facility] and one told her that likely had an ulcer. Patient with burning, difficulty swallowing, and diarrhea. Denies fever and urinary symptoms; just completed ABX course. Patient reports that she went to PCP Gordy Councilman([place]) who told her that she needed an ultrasound; "he couldn't believe that you guys or [facility] did not order one". Of note, PCP did not send her for outpatient ultrasound either. Patient questioning possibility of thyroid problems secondary to the difficulty swallowing. Patient reports that she has been taking Bentyl, but thinks this is the wrong medication.

## 2016-04-08 MED ORDER — TRAMADOL HCL 50 MG PO TABS
50.0000 mg | ORAL_TABLET | Freq: Once | ORAL | Status: AC
  Administered 2016-04-08: 50 mg via ORAL
  Filled 2016-04-08: qty 1

## 2016-04-08 MED ORDER — MORPHINE SULFATE (PF) 4 MG/ML IV SOLN
4.0000 mg | Freq: Once | INTRAVENOUS | Status: AC
  Administered 2016-04-08: 4 mg via INTRAVENOUS
  Filled 2016-04-08: qty 1

## 2016-04-08 MED ORDER — ONDANSETRON 4 MG PO TBDP: 4.0000 mg | ORAL_TABLET | Freq: Three times a day (TID) | ORAL | Status: DC | PRN

## 2016-04-08 MED ORDER — ONDANSETRON HCL 4 MG/2ML IJ SOLN
4.0000 mg | Freq: Once | INTRAMUSCULAR | Status: AC
  Administered 2016-04-08: 4 mg via INTRAVENOUS
  Filled 2016-04-08: qty 2

## 2016-04-08 NOTE — ED Provider Notes (Signed)
-----------------------------------------   12:22 AM on 04/08/2016 -----------------------------------------   Blood pressure 121/78, pulse 73, temperature 98.1 F (36.7 C), temperature source Oral, resp. rate 16, height 5\' 3"  (1.6 m), weight 120 lb (54.432 kg), last menstrual period 04/06/2016, SpO2 100 %, not currently breastfeeding.  Assuming care from Dr. Lenard LancePaduchowski.  In short, Dominique Frost is a 25 y.o. female with a chief complaint of Abdominal Pain .  Refer to the original H&P for additional details.  The current plan of care is to follow up the results of the ultrasound.   Abdominal ultrasound: Partially distended gallbladder may reflect recent by mouth ingestion, chronic gallbladder disease can have this appearance, no gallstones or sonographic findings of acute cholecystitis, normal sonographic appearance of the liver and biliary tree.  I will have the patient follow-up with GI. The patient will be discharged to home.  Rebecka ApleyAllison P Lerlene Treadwell, MD 04/08/16 [phone]

## 2016-04-11 ENCOUNTER — Encounter: Payer: Self-pay | Admitting: Emergency Medicine

## 2016-04-11 ENCOUNTER — Telehealth: Payer: Self-pay

## 2016-04-11 DIAGNOSIS — R197 Diarrhea, unspecified: Secondary | ICD-10-CM | POA: Insufficient documentation

## 2016-04-11 DIAGNOSIS — R1111 Vomiting without nausea: Secondary | ICD-10-CM | POA: Insufficient documentation

## 2016-04-11 DIAGNOSIS — F329 Major depressive disorder, single episode, unspecified: Secondary | ICD-10-CM | POA: Insufficient documentation

## 2016-04-11 DIAGNOSIS — F1990 Other psychoactive substance use, unspecified, uncomplicated: Secondary | ICD-10-CM | POA: Insufficient documentation

## 2016-04-11 DIAGNOSIS — Z79899 Other long term (current) drug therapy: Secondary | ICD-10-CM | POA: Insufficient documentation

## 2016-04-11 DIAGNOSIS — F1210 Cannabis abuse, uncomplicated: Secondary | ICD-10-CM | POA: Insufficient documentation

## 2016-04-11 DIAGNOSIS — F17210 Nicotine dependence, cigarettes, uncomplicated: Secondary | ICD-10-CM | POA: Insufficient documentation

## 2016-04-11 DIAGNOSIS — F4310 Post-traumatic stress disorder, unspecified: Secondary | ICD-10-CM | POA: Insufficient documentation

## 2016-04-11 LAB — COMPREHENSIVE METABOLIC PANEL
ALBUMIN: 5 g/dL (ref 3.5–5.0)
ALT: 16 U/L (ref 14–54)
ANION GAP: 7 (ref 5–15)
AST: 17 U/L (ref 15–41)
Alkaline Phosphatase: 38 U/L (ref 38–126)
BILIRUBIN TOTAL: 0.9 mg/dL (ref 0.3–1.2)
BUN: 10 mg/dL (ref 6–20)
CO2: 24 mmol/L (ref 22–32)
Calcium: 9.7 mg/dL (ref 8.9–10.3)
Chloride: 107 mmol/L (ref 101–111)
Creatinine, Ser: 0.72 mg/dL (ref 0.44–1.00)
GFR calc Af Amer: >60 mL/min (ref >60)
GLUCOSE: 128 mg/dL — AB (ref 65–99)
POTASSIUM: 3.4 mmol/L — AB (ref 3.5–5.1)
Sodium: 138 mmol/L (ref 135–145)
TOTAL PROTEIN: 8 g/dL (ref 6.5–8.1)

## 2016-04-11 LAB — URINALYSIS COMPLETE WITH MICROSCOPIC (ARMC ONLY)
BACTERIA UA: NONE SEEN
Bilirubin Urine: NEGATIVE
Glucose, UA: NEGATIVE mg/dL
Ketones, ur: NEGATIVE mg/dL
LEUKOCYTES UA: NEGATIVE
NITRITE: NEGATIVE
PROTEIN: NEGATIVE mg/dL
SPECIFIC GRAVITY, URINE: 1.015 (ref 1.005–1.030)
pH: 6 (ref 5.0–8.0)

## 2016-04-11 LAB — CBC
HEMATOCRIT: 42.7 % (ref 35.0–47.0)
HEMOGLOBIN: 15.1 g/dL (ref 12.0–16.0)
MCH: 31.9 pg (ref 26.0–34.0)
MCHC: 35.2 g/dL (ref 32.0–36.0)
MCV: 90.5 fL (ref 80.0–100.0)
Platelets: 245 10*3/uL (ref 150–440)
RBC: 4.72 MIL/uL (ref 3.80–5.20)
RDW: 12.5 % (ref 11.5–14.5)
WBC: 10 10*3/uL (ref 3.6–11.0)

## 2016-04-11 LAB — LIPASE, BLOOD: Lipase: 30 U/L (ref 11–51)

## 2016-04-11 NOTE — ED Notes (Signed)
POCT RESULTS WERE NEGATIVE 

## 2016-04-11 NOTE — ED Notes (Signed)
Pt arrived to the ED accompanied by husband for complaints of abdominal pain and "possible bloody stools." Pt states that she has been seen for this symptoms numerous times and the last time she was here she was told that her gall bladder was "stretched" and that eventually it had to come out. Pt reports that today she has been experiencing sharp pains, vomiting and stools with some notable red color. Pt is AOx4 in no apparent distress.

## 2016-04-11 NOTE — Telephone Encounter (Signed)
Patient was in the hospital 04/07/2016-04/08/2016. She was told that her gallbladder will need to be removed. She wanted to know how much the consult and the surgery would cost her with no insurance. I told her that she will need to have $50 upfront, and she will be billed the rest, aproximately $250. I told her that for the surgery, the doctor charges are $800 and the surgery itself can be anywhere between $3,000-$6,000. I also let her know that the hospital does offer a payment plan. She is going to try to find insurance and give us a call back.

## 2016-04-12 ENCOUNTER — Emergency Department
Admission: EM | Admit: 2016-04-12 | Discharge: 2016-04-12 | Disposition: A | Discharge status: Home / Self Care | Payer: Self-pay | Attending: Emergency Medicine | Admitting: Emergency Medicine

## 2016-04-12 DIAGNOSIS — R109 Unspecified abdominal pain: Secondary | ICD-10-CM

## 2016-04-12 MED ORDER — ONDANSETRON HCL 4 MG PO TABS: 4.0000 mg | ORAL_TABLET | Freq: Three times a day (TID) | ORAL | Status: DC | PRN

## 2016-04-12 MED ORDER — SUCRALFATE 1 G PO TABS
1.0000 g | ORAL_TABLET | Freq: Once | ORAL | Status: AC
  Administered 2016-04-12: 1 g via ORAL
  Filled 2016-04-12: qty 1

## 2016-04-12 MED ORDER — SUCRALFATE 1 G PO TABS: 1.0000 g | ORAL_TABLET | Freq: Four times a day (QID) | ORAL | Status: DC

## 2016-04-12 MED ORDER — ONDANSETRON 4 MG PO TBDP
4.0000 mg | ORAL_TABLET | Freq: Once | ORAL | Status: AC
  Administered 2016-04-12: 4 mg via ORAL
  Filled 2016-04-12: qty 1

## 2016-04-12 NOTE — ED Notes (Signed)
Just prior to this RN going in to discharge pt and give pt her medications, pt called out asking to speak with doctor again. This RN went in to room to see if there was anything that I could do, and pt asked this RN to speak with doctor on her behalf to see if he would give her anything other that ordered medications for pain. Pt stated, "I'm just hurting so much and last time they gave me tramadol but it didn't do anything and I really need something." This RN told pt that I would have MD step in to speak with her. MD went to bedside to speak with pt. MD explained that medications that he ordered were what he felt were best for her complaint tonight and that he felt that given that she had been having this pain for over a month now she needed to follow up with GI as [facility] another doctor here. Pt given ordered meds by this RN and discharge instructions reviewed with patient and husband by this RN. Patient verbalized understanding and ambulated to lobby without difficulty.

## 2016-04-12 NOTE — Discharge Instructions (Signed)
Please seek medical attention for any high fevers, chest pain, shortness of breath, change in behavior, persistent vomiting, bloody stool or any other new or concerning symptoms. ° ° °Abdominal Pain, Adult °Many things can cause belly (abdominal) pain. Most times, the belly pain is not dangerous. Many cases of belly pain can be watched and treated at home. °HOME CARE  °· Do not take medicines that help you go poop (laxatives) unless told to by your doctor. °· Only take medicine as told by your doctor. °· Eat or drink as told by your doctor. Your doctor will tell you if you should be on a special diet. °GET HELP IF: °· You do not know what is causing your belly pain. °· You have belly pain while you are sick to your stomach (nauseous) or have runny poop (diarrhea). °· You have pain while you pee or poop. °· Your belly pain wakes you up at night. °· You have belly pain that gets worse or better when you eat. °· You have belly pain that gets worse when you eat fatty foods. °· You have a fever. °GET HELP RIGHT AWAY IF:  °· The pain does not go away within 2 hours. °· You keep throwing up (vomiting). °· The pain changes and is only in the right or left part of the belly. °· You have bloody or tarry looking poop. °MAKE SURE YOU:  °· Understand these instructions. °· Will watch your condition. °· Will get help right away if you are not doing well or get worse. °  °This information is not intended to replace advice given to you by your health care provider. Make sure you discuss any questions you have with your health care provider. °  °Document Released: 03/06/2008 Document Revised: 10/09/2014 Document Reviewed: 05/28/2013 °Elsevier Interactive Patient Education ©2016 [employer] ° °

## 2016-04-12 NOTE — ED Provider Notes (Signed)
[facility] Emergency Department Provider Note    ____________________________________________  Time seen: ~1220  I have reviewed the triage vital signs and the nursing notes.   HISTORY  Chief Complaint Abdominal Pain   History limited by: Not Limited   HPI Dominique Frost Eye is a 25 y.o. female who presents to the emergency department today because of concerns for continued abdominal pain and possible bloody stools today. Patient has been seen in multiple emergency departments for abdominal pain. Most recently she was seen in this emergency department a little less than a week ago. At that time a right upper quadrant ultrasound was performed which did show a slightly dilated gallbladder however no gallstones. She says that when she called the GI office today and told the receptionist her symptoms the receptionist advise her to present to the emergency department. She says she has had continued abdominal pain. It is worse in the upper abdomen. It is severe. It has been accompanied by some red stool. She is unsure if this is blood. She has also had some nausea and vomiting. Denies any fevers.   Past Medical History  Diagnosis Date  . Depression   . PTSD (post-traumatic stress disorder)   . Borderline personality disorder   . Generalized anxiety disorder     Patient Active Problem List   Diagnosis Date Noted  . SVD (spontaneous vaginal delivery) 04/21/2015  . Drug abuse, opioid type 08/05/2014  . Alcohol addiction (HCC) 03/11/2014  . Benzodiazepine dependence (HCC) 03/11/2014  . 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 Borderline personality disorder 10/30/2013  . Cannabis abuse, continuous 10/30/2013  . Neurosis, posttraumatic 10/30/2013  . Compulsive tobacco user syndrome 09/20/2012  . 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 Dilation and  curettage of uterus      Current Outpatient Rx  Name  Route  Sig  Dispense  Refill  . hydrOXYzine (ATARAX/VISTARIL) 25 MG tablet   Oral   Take 25 mg by mouth 3 (three) times daily as needed for anxiety.         . ondansetron (ZOFRAN ODT) 4 MG disintegrating tablet   Oral   Take 1 tablet (4 mg total) by mouth every 8 (eight) hours as needed for nausea or vomiting.   20 tablet   0   . ondansetron (ZOFRAN) 4 MG tablet   Oral   Take 1 tablet (4 mg total) by mouth every 8 (eight) hours as needed for nausea or vomiting.   20 tablet   0   . ondansetron (ZOFRAN-ODT) 4 MG disintegrating tablet   Oral   Take 4 mg by mouth every 8 (eight) hours as needed.         . pantoprazole (PROTONIX) 40 MG tablet   Oral   Take 1 tablet (40 mg total) by mouth daily.   30 tablet   1   . PARoxetine (PAXIL) 20 MG tablet   Oral   Take 20 mg by mouth daily.         . sucralfate (CARAFATE) 1 g tablet   Oral   Take 1 tablet (1 g total) by mouth 4 (four) times daily.   60 tablet   0   . traMADol (ULTRAM) 50 MG tablet   Oral   Take 1 tablet (50 mg total) by mouth every 6 (six) hours as needed.   20 tablet   0  Allergies Clonidine and Vancomycin  Family History  Problem Relation Age of Onset  . Cancer Neg Hx   . Diabetes Neg Hx   . Heart disease Neg Hx     Social History Social History  Substance Use Topics  . Smoking status: Heavy Tobacco Smoker -- 0.50 packs/day for 10 years    Types: Cigarettes  . Smokeless tobacco: Never Used  . Alcohol Use: No    Review of Systems  Constitutional: Negative for fever. Cardiovascular: Negative for chest pain. Respiratory: Negative for shortness of breath. Gastrointestinal: Positive for abdominal pain, diarrhea and vomiting Neurological: Negative for headaches, focal weakness or numbness.  10-point ROS otherwise negative.  ____________________________________________   PHYSICAL EXAM:  VITAL SIGNS: ED Triage Vitals  Enc  Vitals Group     BP 04/11/16 2125 115/69 mmHg     Pulse Rate 04/11/16 2125 92     Resp 04/11/16 2125 20     Temp 04/11/16 2125 98.2 F (36.8 C)     Temp Source 04/11/16 2125 Oral     SpO2 04/11/16 2125 99 %     Weight 04/11/16 2125 121 lb (54.885 kg)     Height 04/11/16 2125  (1.6 m)     Head Cir --      Peak Flow --      Pain Score 04/11/16 2126 10   Constitutional: Alert and oriented. Well appearing and in no distress. Eyes: Conjunctivae are normal. PERRL. Normal extraocular movements. ENT   Head: Normocephalic and atraumatic.   Nose: No congestion/rhinnorhea.   Mouth/Throat: Mucous membranes are moist.   Neck: No stridor. Hematological/Lymphatic/Immunilogical: No cervical lymphadenopathy. Cardiovascular: Normal rate, regular rhythm.  No murmurs, rubs, or gallops. Respiratory: Normal respiratory effort without tachypnea nor retractions. Breath sounds are clear and equal bilaterally. No wheezes/rales/rhonchi. Gastrointestinal: Soft and nontender. No distention.  Genitourinary: Deferred Musculoskeletal: Normal range of motion in all extremities. No joint effusions.  No lower extremity tenderness nor edema. Neurologic:  Normal speech and language. No gross focal neurologic deficits are appreciated.  Skin:  Skin is warm, dry and intact. No rash noted. Psychiatric: Mood and affect are normal. Speech and behavior are normal. Patient exhibits appropriate insight and judgment.  ____________________________________________    LABS (pertinent positives/negatives)  Labs Reviewed  COMPREHENSIVE METABOLIC PANEL - Abnormal; Notable for the following:    Potassium 3.4 (*)    Glucose, Bld 128 (*)    All other components within normal limits  URINALYSIS COMPLETEWITH MICROSCOPIC ([facility] ONLY) - Abnormal; Notable for the following:    Color, Urine YELLOW (*)    APPearance CLEAR (*)    Hgb urine dipstick 1+ (*)    Squamous Epithelial / LPF 0-5 (*)    All other components  within normal limits  LIPASE, BLOOD  CBC  POC URINE PREG, ED     ____________________________________________   EKG  None  ____________________________________________    RADIOLOGY  None  ____________________________________________   PROCEDURES  Procedure(s) performed: None  Critical Care performed: No  ____________________________________________   INITIAL IMPRESSION / ASSESSMENT AND PLAN / ED COURSE  Pertinent labs & imaging results that were available during my care of the patient were reviewed by me and considered in my medical decision making (see chart for details).  Patient presented to the emergency department today with concerns for continued abdominal pain. She also had some complaints of red stool. I did discussed with her that a rectal exam and blood is no other not there was blood there.  Patient declined rectal exam at this time. She did not have any abdominal tenderness on my exam. Blood work without any concerning findings. Will have patient continue to follow-up with GI.  ____________________________________________   FINAL CLINICAL IMPRESSION(S) / ED DIAGNOSES  Final diagnoses:  Abdominal pain, unspecified abdominal location     Note: This dictation was prepared with Dragon dictation. Any transcriptional errors that result from this process are unintentional    Phineas Semen, MD 04/12/16 [phone]

## 2016-10-02 NOTE — L&D Delivery Note (Signed)
Discharge instructions given to pt and SO verbalized understanding.  0325 Pt discharged home ambulatory in stable conditions accompanied by SO.

## 2016-10-02 NOTE — L&D Delivery Note (Signed)
Dr. Feliberto GottronSchermerhorn notified of pt status and request to go home.  Order given that pt may go home

## 2016-10-02 NOTE — L&D Delivery Note (Signed)
Pt to OBS 2 with c/o contractions every 2 minutes.  Pt to change into hospital gown.  [id number] RN to room EFM explained and applied.  Pt c/o contractions and stated she would like to see the doctor and get some pain meds.      Dr. Feliberto GottronSchermerhorn notified of pt status by Patria Maneiffany Roberts RN order given for Terb times one dose now and repeat in 2 hrs if needed

## 2016-10-02 NOTE — L&D Delivery Note (Addendum)
Delivery Note At 2:06 AM, CNM was called to go to room 3 due the FHR in the 60's, pt partially turned to her left with a C/C/+3 station, scalp stim with FHR to the 114's and then set up for delivery and Nsy RN and NP in the room.  Pt enc to push, vacuum requested but, pt pushed x 2 and  a viable  female was delivered via Vaginal, Spontaneous Delivery (Presentation: ; Straight OA ).  APGAR: 7, 9; weight 6 lb 6.3 oz (2900 g).   Cord: loose around the neck x 1 with reduction. Vtx, Ant and post shoulder and body del and to mom's abd with stim and baby crying and a floppy at first but, then improved. Delayed cord clamping but, after 3 mins, cord cut after clamping x 2 due to baby going to the warmer for assessment. 3 VC, SDOP intact  Cord blood sent. No needles and no sponges.1 Lap on table and not used. NO Lacs, EBL:200 ml's.   Anesthesia:  Epidural  Episiotomy: None Lacerations: None Suture Repair: None Est. Blood Loss (mL): 200  Mom will be transported to pp .  Baby to Mom.  Methadone dose will continue daily as 60 mg po at 5-6am and RN will call her clinic and advise of her admission and will check on dose and drug screens needed.  [name] 06/23/2017, 2:33 AM

## 2016-10-17 ENCOUNTER — Encounter: Payer: Self-pay | Admitting: *Deleted

## 2016-10-17 ENCOUNTER — Ambulatory Visit
Admission: EM | Admit: 2016-10-17 | Discharge: 2016-10-17 | Disposition: A | Discharge status: Home / Self Care | Payer: Medicaid Other | Attending: Emergency Medicine | Admitting: Emergency Medicine

## 2016-10-17 DIAGNOSIS — Z833 Family history of diabetes mellitus: Secondary | ICD-10-CM | POA: Insufficient documentation

## 2016-10-17 DIAGNOSIS — Z881 Allergy status to other antibiotic agents status: Secondary | ICD-10-CM | POA: Insufficient documentation

## 2016-10-17 DIAGNOSIS — R079 Chest pain, unspecified: Secondary | ICD-10-CM | POA: Insufficient documentation

## 2016-10-17 DIAGNOSIS — F329 Major depressive disorder, single episode, unspecified: Secondary | ICD-10-CM | POA: Insufficient documentation

## 2016-10-17 DIAGNOSIS — R0602 Shortness of breath: Secondary | ICD-10-CM | POA: Insufficient documentation

## 2016-10-17 DIAGNOSIS — J9801 Acute bronchospasm: Secondary | ICD-10-CM | POA: Insufficient documentation

## 2016-10-17 DIAGNOSIS — Z888 Allergy status to other drugs, medicaments and biological substances status: Secondary | ICD-10-CM | POA: Insufficient documentation

## 2016-10-17 DIAGNOSIS — F4310 Post-traumatic stress disorder, unspecified: Secondary | ICD-10-CM | POA: Insufficient documentation

## 2016-10-17 DIAGNOSIS — J209 Acute bronchitis, unspecified: Secondary | ICD-10-CM

## 2016-10-17 DIAGNOSIS — F603 Borderline personality disorder: Secondary | ICD-10-CM | POA: Insufficient documentation

## 2016-10-17 DIAGNOSIS — J069 Acute upper respiratory infection, unspecified: Secondary | ICD-10-CM | POA: Insufficient documentation

## 2016-10-17 DIAGNOSIS — F17210 Nicotine dependence, cigarettes, uncomplicated: Secondary | ICD-10-CM | POA: Insufficient documentation

## 2016-10-17 DIAGNOSIS — J029 Acute pharyngitis, unspecified: Secondary | ICD-10-CM | POA: Insufficient documentation

## 2016-10-17 DIAGNOSIS — Z809 Family history of malignant neoplasm, unspecified: Secondary | ICD-10-CM | POA: Insufficient documentation

## 2016-10-17 DIAGNOSIS — Z8249 Family history of ischemic heart disease and other diseases of the circulatory system: Secondary | ICD-10-CM | POA: Insufficient documentation

## 2016-10-17 DIAGNOSIS — R300 Dysuria: Secondary | ICD-10-CM | POA: Insufficient documentation

## 2016-10-17 LAB — URINALYSIS, COMPLETE (UACMP) WITH MICROSCOPIC
Bacteria, UA: NONE SEEN
Bilirubin Urine: NEGATIVE
GLUCOSE, UA: NEGATIVE mg/dL
HGB URINE DIPSTICK: NEGATIVE
Ketones, ur: NEGATIVE mg/dL
LEUKOCYTES UA: NEGATIVE
NITRITE: NEGATIVE
PROTEIN: NEGATIVE mg/dL
RBC / HPF: NONE SEEN RBC/hpf (ref 0–5)
Specific Gravity, Urine: <1.005 — ABNORMAL LOW (ref 1.005–1.030)
pH: 6.5 (ref 5.0–8.0)

## 2016-10-17 LAB — PREGNANCY, URINE: Preg Test, Ur: NEGATIVE

## 2016-10-17 MED ORDER — PREDNISONE 50 MG PO TABS: 50.0000 mg | ORAL_TABLET | Freq: Every day | ORAL | 0 refills | Status: DC

## 2016-10-17 MED ORDER — AEROCHAMBER PLUS MISC: 2 refills | Status: DC

## 2016-10-17 MED ORDER — FLUTICASONE PROPIONATE 50 MCG/ACT NA SUSP: 2.0000 | Freq: Every day | NASAL | 0 refills | Status: DC

## 2016-10-17 MED ORDER — IBUPROFEN 600 MG PO TABS: 600.0000 mg | ORAL_TABLET | Freq: Four times a day (QID) | ORAL | 0 refills | Status: DC | PRN

## 2016-10-17 MED ORDER — ALBUTEROL SULFATE HFA 108 (90 BASE) MCG/ACT IN AERS: 1.0000 | INHALATION_SPRAY | Freq: Four times a day (QID) | RESPIRATORY_TRACT | 0 refills | Status: DC | PRN

## 2016-10-17 MED ORDER — GUAIFENESIN-CODEINE 100-10 MG/5ML PO SYRP: 10.0000 mL | ORAL_SOLUTION | Freq: Four times a day (QID) | ORAL | 0 refills | Status: DC | PRN

## 2016-10-17 NOTE — Discharge Instructions (Signed)
2 puffs from your albuterol inhaler every 4-6 hours using her spacer as needed for coughing, wheezing, shortness of breath. Make sure you finish prednisone. Take 600 mg of ibuprofen with 1 g of Tylenol 4 times a day as needed for body aches and chest soreness.

## 2016-10-17 NOTE — ED Triage Notes (Signed)
Dysuria, urinary freq/urg, left flank pain, x3 weeks. Also, productive cough- green, with chest congestion and soreness, runny nose and eyes, x 2-3 days.

## 2016-10-17 NOTE — ED Provider Notes (Signed)
HPI  SUBJECTIVE:  Dominique Frost is a 26 y.o. female who presents with chest pain described as diffuse soreness, cough productive of yellowish green mucus, bodyaches, nasal congestion, clear rhinorrhea, postnasal drip, shortness of breath. She reports sore throat secondary to the coughing. She reports some sneezing, but no itchy, watery eyes. No fevers, posttussive emesis, wheezing, dyspnea on exertion. No guard GERD symptoms. She states that her daughter has similar symptoms. States that she is unable to sleep at night secondary to the cough. She has tried Aleve, Excedrin without improvement in her symptoms. Symptoms are worse going out into the cold air. No antipyretic in the past 6-8 hours. She has a 15-pack-year history of smoking, no history of asthma, emphysema, COPD, GERD, allergies, diabetes, hypertension. LMP: 12/22, she denies possibility pregnant. PMD: None.  She is also complaining of dysuria and would like to check and make sure that she does not have a urinary tract infection.    Past Medical History:  Diagnosis Date  . Borderline personality disorder   . Depression   . Generalized anxiety disorder   . PTSD (post-traumatic stress disorder)     Past Surgical History:  Procedure Laterality Date  . DILATION AND CURETTAGE OF UTERUS      Family History  Problem Relation Age of Onset  . Cancer Neg Hx   . Diabetes Neg Hx   . Heart disease Neg Hx     Social History  Substance Use Topics  . Smoking status: Heavy Tobacco Smoker    Packs/day: 0.50    Years: 10.00    Types: Cigarettes  . Smokeless tobacco: Never Used  . Alcohol use No    No current facility-administered medications for this encounter.   Current Outpatient Prescriptions:  .  PARoxetine (PAXIL) 20 MG tablet, Take 20 mg by mouth daily., Disp: , Rfl:  .  albuterol (PROVENTIL HFA;VENTOLIN HFA) 108 (90 Base) MCG/ACT inhaler, Inhale 1-2 puffs into the lungs every 6 (six) hours as needed for wheezing or  shortness of breath., Disp: 1 Inhaler, Rfl: 0 .  fluticasone (FLONASE) 50 MCG/ACT nasal spray, Place 2 sprays into both nostrils daily., Disp: 16 g, Rfl: 0 .  guaiFENesin-codeine (CHERATUSSIN AC) 100-10 MG/5ML syrup, Take 10 mLs by mouth 4 (four) times daily as needed for cough or congestion., Disp: 120 mL, Rfl: 0 .  ibuprofen (ADVIL,MOTRIN) 600 MG tablet, Take 1 tablet (600 mg total) by mouth every 6 (six) hours as needed., Disp: 30 tablet, Rfl: 0 .  predniSONE (DELTASONE) 50 MG tablet, Take 1 tablet (50 mg total) by mouth daily with breakfast., Disp: 5 tablet, Rfl: 0 .  Spacer/Aero-Holding Chambers (AEROCHAMBER PLUS) inhaler, Use as instructed, Disp: 1 each, Rfl: 2  Allergies  Allergen Reactions  . Clonidine     Other reaction(s): RASH  . Vancomycin     Other reaction(s): RASH     ROS  As noted in HPI.   Physical Exam  BP 116/69 (BP Location: Left Arm)   Pulse 60   Temp 98.3 F (36.8 C) (Oral)   Resp 16   Ht 5\' 3"  (1.6 m)   Wt 130 lb (59 kg)   LMP 09/22/2016 (Approximate)   SpO2 100%   BMI 23.03 kg/m   Constitutional: Well developed, well nourished, no acute distress Eyes:  EOMI, conjunctiva normal bilaterally HENT: Normocephalic, atraumatic,mucus membranes moist TMs normal bilaterally. Positive clear rhinorrhea. No sinus tenderness. Normal oropharynx. No cobblestoning or obvious postnasal drip.  Neck: No cervical lymphadenopathy  Respiratory:  Normal inspiratory effort diffuse wheezing and occasional rhonchi. No rales. Cardiovascular: Normal rate regular rhythm no murmurs rubs gallops GI: nondistended skin: No rash, skin intact Musculoskeletal: no deformities Neurologic: Alert & oriented x 3, no focal neuro deficits Psychiatric: Speech and behavior appropriate   ED Course   Medications - No data to display  Orders Placed This Encounter  Procedures  . Urine culture    Standing Status:   Standing    Number of Occurrences:   1  . Urinalysis, Complete w  Microscopic    Standing Status:   Standing    Number of Occurrences:   1  . Pregnancy, urine    Standing Status:   Standing    Number of Occurrences:   1    Results for orders placed or performed during the hospital encounter of 10/17/16 (from the past 24 hour(s))  Urinalysis, Complete w Microscopic     Status: Abnormal   Collection Time: 10/17/16  3:57 PM  Result Value Ref Range   Color, Urine STRAW (A) YELLOW   APPearance CLEAR CLEAR   Specific Gravity, Urine <1.005 (L) 1.005 - 1.030   pH 6.5 5.0 - 8.0   Glucose, UA NEGATIVE NEGATIVE mg/dL   Hgb urine dipstick NEGATIVE NEGATIVE   Bilirubin Urine NEGATIVE NEGATIVE   Ketones, ur NEGATIVE NEGATIVE mg/dL   Protein, ur NEGATIVE NEGATIVE mg/dL   Nitrite NEGATIVE NEGATIVE   Leukocytes, UA NEGATIVE NEGATIVE   Squamous Epithelial / LPF 0-5 (A) NONE SEEN   WBC, UA 0-5 0 - 5 WBC/hpf   RBC / HPF NONE SEEN 0 - 5 RBC/hpf   Bacteria, UA NONE SEEN NONE SEEN  Pregnancy, urine     Status: None   Collection Time: 10/17/16  3:57 PM  Result Value Ref Range   Preg Test, Ur NEGATIVE NEGATIVE   No results found.  ED Clinical Impression  Upper respiratory tract infection, unspecified type  Bronchospasm with bronchitis, acute  Dysuria   ED Assessment/Plan Urine is negative for UTI, hematuria.  [facility] narcotic database reviewed. She has had one opiate prescription back in October 2017. No other controlled substance prescriptions.  Discussed urine results with patient. She seems satisfied with knowing that this does not appear to be a UTI, however, will send urine off for culture just to exclude this as she states that she does get frequent UTIs. Discussed the with her other causes of dysuria, but she wished to address her upper respiratory symptoms today on this visit. She has opted to push fluids, try some over-the-counter home remedies. She will come back here or follow up with primary care physician of her choice if she has  persistent symptoms.   Presentation most consistent with a URI with bronchospasm/bronchitis. Sending home with prednisone 50 mg a day for 5 days, albuterol with spacer, Cheratussin, advised regular Mucinex, Flonase, saline nasal irrigation. providing primary care referral. She may also return here if not getting better.   Discussed labs, MDM, plan and followup with patient. Discussed sn/sx that should prompt return to the ED. Patient  agrees with plan.   Meds ordered this encounter  Medications  . albuterol (PROVENTIL HFA;VENTOLIN HFA) 108 (90 Base) MCG/ACT inhaler    Sig: Inhale 1-2 puffs into the lungs every 6 (six) hours as needed for wheezing or shortness of breath.    Dispense:  1 Inhaler    Refill:  0  . Spacer/Aero-Holding Chambers (AEROCHAMBER PLUS) inhaler    Sig: Use as instructed  Dispense:  1 each    Refill:  2  . predniSONE (DELTASONE) 50 MG tablet    Sig: Take 1 tablet (50 mg total) by mouth daily with breakfast.    Dispense:  5 tablet    Refill:  0  . guaiFENesin-codeine (CHERATUSSIN AC) 100-10 MG/5ML syrup    Sig: Take 10 mLs by mouth 4 (four) times daily as needed for cough or congestion.    Dispense:  120 mL    Refill:  0  . fluticasone (FLONASE) 50 MCG/ACT nasal spray    Sig: Place 2 sprays into both nostrils daily.    Dispense:  16 g    Refill:  0  . ibuprofen (ADVIL,MOTRIN) 600 MG tablet    Sig: Take 1 tablet (600 mg total) by mouth every 6 (six) hours as needed.    Dispense:  30 tablet    Refill:  0    *This clinic note was created using Scientist, clinical (histocompatibility and immunogenetics)Dragon dictation software. Therefore, there may be occasional mistakes despite careful proofreading.  ?   Domenick GongAshley Leilah Polimeni, MD 10/19/16 1244

## 2016-10-19 LAB — URINE CULTURE

## 2016-11-09 ENCOUNTER — Emergency Department
Admission: EM | Admit: 2016-11-09 | Discharge: 2016-11-09 | Disposition: A | Discharge status: Home / Self Care | Payer: Medicaid Other | Attending: Emergency Medicine | Admitting: Emergency Medicine

## 2016-11-09 ENCOUNTER — Emergency Department: Payer: Medicaid Other

## 2016-11-09 ENCOUNTER — Encounter: Payer: Self-pay | Admitting: Emergency Medicine

## 2016-11-09 DIAGNOSIS — Z23 Encounter for immunization: Secondary | ICD-10-CM | POA: Insufficient documentation

## 2016-11-09 DIAGNOSIS — N898 Other specified noninflammatory disorders of vagina: Secondary | ICD-10-CM | POA: Diagnosis not present

## 2016-11-09 DIAGNOSIS — F17210 Nicotine dependence, cigarettes, uncomplicated: Secondary | ICD-10-CM | POA: Diagnosis not present

## 2016-11-09 DIAGNOSIS — Z3A01 Less than 8 weeks gestation of pregnancy: Secondary | ICD-10-CM | POA: Insufficient documentation

## 2016-11-09 DIAGNOSIS — O209 Hemorrhage in early pregnancy, unspecified: Secondary | ICD-10-CM | POA: Diagnosis not present

## 2016-11-09 DIAGNOSIS — O219 Vomiting of pregnancy, unspecified: Secondary | ICD-10-CM | POA: Diagnosis not present

## 2016-11-09 DIAGNOSIS — Z2913 Encounter for prophylactic Rho(D) immune globulin: Secondary | ICD-10-CM | POA: Insufficient documentation

## 2016-11-09 DIAGNOSIS — O4690 Antepartum hemorrhage, unspecified, unspecified trimester: Secondary | ICD-10-CM

## 2016-11-09 LAB — CBC
HCT: 39.1 % (ref 35.0–47.0)
Hemoglobin: 13.8 g/dL (ref 12.0–16.0)
MCH: 32.2 pg (ref 26.0–34.0)
MCHC: 35.4 g/dL (ref 32.0–36.0)
MCV: 90.8 fL (ref 80.0–100.0)
PLATELETS: 215 10*3/uL (ref 150–440)
RBC: 4.3 MIL/uL (ref 3.80–5.20)
RDW: 12.6 % (ref 11.5–14.5)
WBC: 10.7 10*3/uL (ref 3.6–11.0)

## 2016-11-09 LAB — HCG, QUANTITATIVE, PREGNANCY: hCG, Beta Chain, Quant, S: 99752 m[IU]/mL — ABNORMAL HIGH (ref <5)

## 2016-11-09 LAB — ABO/RH: ABO/RH(D): O NEG

## 2016-11-09 LAB — ANTIBODY SCREEN: ANTIBODY SCREEN: NEGATIVE

## 2016-11-09 MED ORDER — RHO D IMMUNE GLOBULIN 1500 UNIT/2ML IJ SOSY
300.0000 ug | PREFILLED_SYRINGE | Freq: Once | INTRAMUSCULAR | Status: AC
  Administered 2016-11-09: 300 ug via INTRAMUSCULAR
  Filled 2016-11-09: qty 2

## 2016-11-09 NOTE — ED Triage Notes (Signed)
Pt reports vaginal bleeding that began this morning with lower abdominal cramping. Pt states she is [redacted] weeks pregnant.

## 2016-11-09 NOTE — ED Provider Notes (Signed)
[facility] Emergency Department Provider Note  ____________________________________________  Time seen: Approximately 4:16 PM  I have reviewed the triage vital signs and the nursing notes.   HISTORY  Chief Complaint Vaginal Bleeding   HPI Dominique Frost is a 26 y.o. female [id number] currently at [redacted] weeks GA based on LMP (09/22/16) who presents for vaginal bleeding. Patient has not yet established care for this pregnancy. She reports that this morning she noticed blood in the toilet paper when she wiped and since then has had minimal spotting. No large volume bleeding, no blood clots. She denies abdominal pain, back pain, dizziness, nausea, vomiting, chest pain. She is followed by [facility] clinic for her prior pregnancies but has not yet had a chance to schedule an appointment with them. She reports one prior molar pregnancy and reports that she has been  vomiting a lot during this pregnancy which is similar to her prior molar pregnancy.  Past Medical History:  Diagnosis Date  . Borderline personality disorder   . Depression   . Generalized anxiety disorder   . PTSD (post-traumatic stress disorder)     Patient Active Problem List   Diagnosis Date Noted  . SVD (spontaneous vaginal delivery) 04/21/2015  . Drug abuse, opioid type 08/05/2014  . Alcohol addiction (HCC) 03/11/2014  . Benzodiazepine dependence (HCC) 03/11/2014  . 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 Borderline personality disorder 10/30/2013  . Cannabis abuse, continuous 10/30/2013  . Neurosis, posttraumatic 10/30/2013  . Compulsive tobacco user syndrome 09/20/2012  . 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 DILATION AND CURETTAGE OF UTERUS      Prior to Admission medications   Medication Sig Start Date End Date Taking? Authorizing Provider  albuterol (PROVENTIL  HFA;VENTOLIN HFA) 108 (90 Base) MCG/ACT inhaler Inhale 1-2 puffs into the lungs every 6 (six) hours as needed for wheezing or shortness of breath. 10/17/16   Domenick Gong, MD  fluticasone (FLONASE) 50 MCG/ACT nasal spray Place 2 sprays into both nostrils daily. 10/17/16   Domenick Gong, MD  guaiFENesin-codeine [facility]) 100-10 MG/5ML syrup Take 10 mLs by mouth 4 (four) times daily as needed for cough or congestion. 10/17/16   Domenick Gong, MD  ibuprofen (ADVIL,MOTRIN) 600 MG tablet Take 1 tablet (600 mg total) by mouth every 6 (six) hours as needed. 10/17/16   Domenick Gong, MD  PARoxetine (PAXIL) 20 MG tablet Take 20 mg by mouth daily.    Historical Provider, MD  predniSONE (DELTASONE) 50 MG tablet Take 1 tablet (50 mg total) by mouth daily with breakfast. 10/17/16   Domenick Gong, MD  Spacer/Aero-Holding Chambers (AEROCHAMBER PLUS) inhaler Use as instructed 10/17/16   Domenick Gong, MD    Allergies Clonidine and Vancomycin  Family History  Problem Relation Age of Onset  . Cancer Neg Hx   . Diabetes Neg Hx   . Heart disease Neg Hx     Social History Social History  Substance Use Topics  . Smoking status: Heavy Tobacco Smoker    Packs/day: 0.50    Years: 10.00    Types: Cigarettes  . Smokeless tobacco: Never Used  . Alcohol use No    Review of Systems  Constitutional: Negative for fever. Eyes: Negative for visual changes. ENT: Negative for sore throat. Neck: No neck pain  Cardiovascular: Negative for chest pain. Respiratory: Negative for shortness of breath. Gastrointestinal: Negative for abdominal pain, diarrhea. + N/V Genitourinary:  Negative for dysuria. + vaginal bleeding  Musculoskeletal: Negative for back pain. Skin: Negative for rash. Neurological: Negative for headaches, weakness or numbness. Psych: No SI or HI  ____________________________________________   PHYSICAL EXAM:  VITAL SIGNS: ED Triage Vitals  Enc Vitals Group     BP 11/09/16  1435 (!) 117/56     Pulse Rate 11/09/16 1435 72     Resp 11/09/16 1435 18     Temp 11/09/16 1435 97.6 F (36.4 C)     Temp Source 11/09/16 1435 Oral     SpO2 11/09/16 1435 98 %     Weight 11/09/16 1435 136 lb (61.7 kg)     Height 11/09/16 1435 5\' 2"  (1.575 m)     Head Circumference --      Peak Flow --      Pain Score 11/09/16 1436 7     Pain Loc --      Pain Edu? --      Excl. in GC? --     Constitutional: Alert and oriented. Well appearing and in no apparent distress. HEENT:      Head: Normocephalic and atraumatic.         Eyes: Conjunctivae are normal. Sclera is non-icteric. EOMI. PERRL      Mouth/Throat: Mucous membranes are moist.       Neck: Supple with no signs of meningismus. Cardiovascular: Regular rate and rhythm. No murmurs, gallops, or rubs. 2+ symmetrical distal pulses are present in all extremities. No JVD. Respiratory: Normal respiratory effort. Lungs are clear to auscultation bilaterally. No wheezes, crackles, or rhonchi.  Gastrointestinal: Soft, non tender, and non distended with positive bowel sounds. No rebound or guarding. Musculoskeletal: Nontender with normal range of motion in all extremities. No edema, cyanosis, or erythema of extremities. Neurologic: Normal speech and language. Face is symmetric. Moving all extremities. No gross focal neurologic deficits are appreciated. Skin: Skin is warm, dry and intact. No rash noted. Psychiatric: Mood and affect are normal. Speech and behavior are normal.  ____________________________________________   LABS (all labs ordered are listed, but only abnormal results are displayed)  Labs Reviewed  HCG, QUANTITATIVE, PREGNANCY - Abnormal; Notable for the following:       Result Value   hCG, Beta Chain, Quant, S [id number] (*)    All other components within normal limits  CBC  ABO/RH  ANTIBODY SCREEN  RHOGAM INJECTION    ____________________________________________  EKG  none____________________________________________  RADIOLOGY  TV US:  Single live intrauterine pregnancy, gestational age by ultrasound 6 weeks and 4 days without immediate complication. ____________________________________________   PROCEDURES  Procedure(s) performed: None Procedures Critical Care performed:  None ____________________________________________   INITIAL IMPRESSION / ASSESSMENT AND PLAN / ED COURSE   26 y.o. female [id number] currently at [redacted] weeks GA based on LMP (09/22/16) who presents for vaginal bleeding. Patient is concerned she might have a molar pregnancy as she had one in the past and she reports she's been having a lot of nausea and vomiting with this current pregnancy. hgb stable. Vitals stable. Patient is O-, will give Rhogam. Will get formal TVUS to rule out molar pregnancy.   ED COURSE  Transvaginal ultrasound showing single IUP at 6 weeks and 4 days. Patient received Rhogam. No heavy bleeding. We'll discharge with close follow-up with OB/GYN. Discussed return precautions for signs or symptoms of symptomatic anemia.  Pertinent labs & imaging results that were available during my care of the patient were reviewed by me and considered in my medical decision  making (see chart for details).    ____________________________________________   FINAL CLINICAL IMPRESSION(S) / ED DIAGNOSES  Final diagnoses:  Vaginal bleeding in pregnancy      NEW MEDICATIONS STARTED DURING THIS VISIT:  New Prescriptions   No medications on file     Note:  This document was prepared using Dragon voice recognition software and may include unintentional dictation errors.    Nita Sickle, MD 11/09/16 [phone]

## 2016-11-09 NOTE — ED Notes (Signed)
This RN informed by Carollee HerterShannon, RN that patient asked which way was the exit prior to receiving D/C instructions. Pt departed from the ER ambulatory with no acute distress noted.

## 2016-11-09 NOTE — ED Notes (Signed)
RN called blood bank for Rh IM injection.

## 2016-11-09 NOTE — Discharge Instructions (Signed)
Call your OB/GYN for an appointment within a week to repeat ultrasound to make sure you are not having a miscarriage. If you are bleeding heavily and start to have dizziness, chest pain, shortness of breath you must return to the emergency room for reevaluation.

## 2016-11-10 LAB — RHOGAM INJECTION: UNIT DIVISION: 0

## 2016-11-13 ENCOUNTER — Encounter: Payer: Self-pay | Admitting: Emergency Medicine

## 2016-11-13 ENCOUNTER — Ambulatory Visit
Admission: EM | Admit: 2016-11-13 | Discharge: 2016-11-13 | Disposition: A | Discharge status: Home / Self Care | Payer: Self-pay | Attending: Internal Medicine | Admitting: Internal Medicine

## 2016-11-13 DIAGNOSIS — R110 Nausea: Secondary | ICD-10-CM

## 2016-11-13 DIAGNOSIS — R6889 Other general symptoms and signs: Secondary | ICD-10-CM

## 2016-11-13 LAB — URINALYSIS, COMPLETE (UACMP) WITH MICROSCOPIC
BILIRUBIN URINE: NEGATIVE
Glucose, UA: NEGATIVE mg/dL
HGB URINE DIPSTICK: NEGATIVE
LEUKOCYTES UA: NEGATIVE
Nitrite: NEGATIVE
Protein, ur: NEGATIVE mg/dL
pH: 6 (ref 5.0–8.0)

## 2016-11-13 MED ORDER — OSELTAMIVIR PHOSPHATE 75 MG PO CAPS: 75.0000 mg | ORAL_CAPSULE | Freq: Two times a day (BID) | ORAL | 0 refills | Status: DC

## 2016-11-13 MED ORDER — ONDANSETRON HCL 4 MG PO TABS: 4.0000 mg | ORAL_TABLET | Freq: Four times a day (QID) | ORAL | 1 refills | Status: DC

## 2016-11-13 NOTE — ED Triage Notes (Signed)
Patient c/o N/V off and on for 2 weeks.  Patient states that she is [redacted] weeks pregnant.  Patient c/o right ear pain for the past 2 days.  Patient denies chest pain or SOB at this time. Patient reports cold symptoms for the past 3-4 days.  Patient denies fevers. Patient reports some urinary burning for the past week.

## 2016-11-14 NOTE — ED Provider Notes (Addendum)
[facility]    CSN: [id number] Arrival date & time: 11/13/16  1321     History   Chief Complaint Chief Complaint  Patient presents with  . Morning Sickness  . Otalgia    right ear    HPI Dominique Frost is a 26 y.o. female.   Pt who reports that she is [redacted] weeks pregnant c/o nausea and panic attacks. She also admits to body aches and subjective fevers.  Recent seen in the ED for spotting.  It is not heavy.        Past Medical History:  Diagnosis Date  . Borderline personality disorder   . Depression   . Generalized anxiety disorder   . PTSD (post-traumatic stress disorder)     Patient Active Problem List   Diagnosis Date Noted  . SVD (spontaneous vaginal delivery) 04/21/2015  . Drug abuse, opioid type 08/05/2014  . Alcohol addiction (HCC) 03/11/2014  . Benzodiazepine dependence (HCC) 03/11/2014  . 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 Borderline personality disorder 10/30/2013  . Cannabis abuse, continuous 10/30/2013  . Neurosis, posttraumatic 10/30/2013  . Compulsive tobacco user syndrome 09/20/2012  . 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 DILATION AND CURETTAGE OF UTERUS      OB History    Gravida Para Term Preterm AB Living   5 3 3   1 1    SAB TAB Ectopic Multiple Live Births   1     0 1      Obstetric Comments   D&C after SAB       Home Medications    Prior to Admission medications   Medication Sig Start Date End Date Taking? Authorizing Provider  Prenatal Multivit-Min-Fe-FA (PRENATAL VITAMINS PO) Take by mouth.   Yes Historical Provider, MD  sertraline (ZOLOFT) 100 MG tablet Take 100 mg by mouth daily.   Yes Historical Provider, MD  ondansetron (ZOFRAN) 4 MG tablet Take 1 tablet (4 mg total) by mouth every 6 (six) hours. 11/13/16   Arnaldo Natal, MD  oseltamivir (TAMIFLU) 75 MG capsule Take 1 capsule (75 mg  total) by mouth every 12 (twelve) hours. 11/13/16   Arnaldo Natal, MD    Family History Family History  Problem Relation Age of Onset  . Cancer Neg Hx   . Diabetes Neg Hx   . Heart disease Neg Hx     Social History Social History  Substance Use Topics  . Smoking status: Heavy Tobacco Smoker    Packs/day: 0.50    Years: 10.00    Types: Cigarettes  . Smokeless tobacco: Never Used  . Alcohol use No     Allergies   Clonidine and Vancomycin   Review of Systems Review of Systems  Constitutional: Positive for fever (subjective). Negative for chills.  HENT: Positive for ear pain. Negative for sore throat and tinnitus.   Eyes: Negative for redness.  Respiratory: Positive for shortness of breath (with anxiety). Negative for cough.   Cardiovascular: Positive for chest pain (during panic attacks). Negative for palpitations.  Gastrointestinal: Positive for nausea. Negative for abdominal pain, diarrhea and vomiting.  Genitourinary: Negative for dysuria, frequency and urgency.  Musculoskeletal: Negative for myalgias.  Skin: Negative for rash.       No lesions  Neurological: Negative for weakness.  Hematological: Does not bruise/bleed easily.  Psychiatric/Behavioral: Negative for suicidal ideas.     Physical Exam Triage Vital Signs  ED Triage Vitals  Enc Vitals Group     BP 11/13/16 1556 111/62     Pulse Rate 11/13/16 1556 (!) 56     Resp 11/13/16 1556 16     Temp 11/13/16 1556 98.3 F (36.8 C)     Temp Source 11/13/16 1556 Oral     SpO2 11/13/16 1556 100 %     Weight 11/13/16 1553 136 lb (61.7 kg)     Height 11/13/16 1553 5\' 3"  (1.6 m)     Head Circumference --      Peak Flow --      Pain Score 11/13/16 1555 6     Pain Loc --      Pain Edu? --      Excl. in GC? --    No data found.   Updated Vital Signs BP 111/62 (BP Location: Left Arm)   Pulse (!) 56   Temp 98.3 F (36.8 C) (Oral)   Resp 16   Ht 5\' 3"  (1.6 m)   Wt 136 lb (61.7 kg)   LMP 09/22/2016    SpO2 100%   BMI 24.09 kg/m   Visual Acuity Right Eye Distance:   Left Eye Distance:   Bilateral Distance:    Right Eye Near:   Left Eye Near:    Bilateral Near:     Physical Exam  Constitutional: She is oriented to person, place, and time. She appears well-developed and well-nourished. No distress.  HENT:  Head: Normocephalic and atraumatic.  Mouth/Throat: Oropharynx is clear and moist.  Eyes: Conjunctivae and EOM are normal. Pupils are equal, round, and reactive to light. No scleral icterus.  Neck: Normal range of motion. Neck supple. No JVD present. No tracheal deviation present. No thyromegaly present.  Cardiovascular: Normal rate, regular rhythm and normal heart sounds.  Exam reveals no gallop and no friction rub.   No murmur heard. Pulmonary/Chest: Effort normal and breath sounds normal.  Abdominal: Soft. Bowel sounds are normal. She exhibits no distension. There is no tenderness.  Musculoskeletal: Normal range of motion. She exhibits no edema.  Lymphadenopathy:    She has no cervical adenopathy.  Neurological: She is alert and oriented to person, place, and time. No cranial nerve deficit.  Skin: Skin is warm and dry.  Psychiatric: She has a normal mood and affect. Her behavior is normal. Judgment and thought content normal.  Nursing note and vitals reviewed.    UC Treatments / Results  Labs (all labs ordered are listed, but only abnormal results are displayed) Labs Reviewed  URINALYSIS, COMPLETE (UACMP) WITH MICROSCOPIC - Abnormal; Notable for the following:       Result Value   APPearance HAZY (*)    Specific Gravity, Urine >1.030 (*)    Ketones, ur TRACE (*)    Squamous Epithelial / LPF 0-5 (*)    Bacteria, UA FEW (*)    All other components within normal limits    EKG  EKG Interpretation None       Radiology No results found.  Procedures Procedures (including critical care time)  Medications Ordered in UC Medications - No data to  display   Initial Impression / Assessment and Plan / UC Course  I have reviewed the triage vital signs and the nursing notes.  Pertinent labs & imaging results that were available during my care of the patient were reviewed by me and considered in my medical decision making (see chart for details).     Likely morning sickness. Data for Zofran  in pregnancy not available but commonly used. Informed patient that no anxiolytics are recommended during pregnancy. May continue Zoloft (category C).  Preemptively treat for flu.  Final Clinical Impressions(s) / UC Diagnoses   Final diagnoses:  Flu-like symptoms  Nausea    New Prescriptions Discharge Medication List as of 11/13/2016  5:04 PM    START taking these medications   Details  ondansetron (ZOFRAN) 4 MG tablet Take 1 tablet (4 mg total) by mouth every 6 (six) hours., Starting Mon 11/13/2016, Print    oseltamivir (TAMIFLU) 75 MG capsule Take 1 capsule (75 mg total) by mouth every 12 (twelve) hours., Starting Mon 11/13/2016, Normal         Arnaldo NatalMichael S Laveta Gilkey, MD 11/14/16 [id number]    Arnaldo NatalMichael S Torin Modica, MD 11/14/16 [phone]

## 2016-11-22 ENCOUNTER — Emergency Department
Admission: EM | Admit: 2016-11-22 | Discharge: 2016-11-22 | Disposition: A | Discharge status: Home / Self Care | Payer: Medicaid Other | Attending: Emergency Medicine | Admitting: Emergency Medicine

## 2016-11-22 ENCOUNTER — Emergency Department: Payer: Medicaid Other

## 2016-11-22 DIAGNOSIS — O99331 Smoking (tobacco) complicating pregnancy, first trimester: Secondary | ICD-10-CM | POA: Insufficient documentation

## 2016-11-22 DIAGNOSIS — O200 Threatened abortion: Secondary | ICD-10-CM | POA: Diagnosis not present

## 2016-11-22 DIAGNOSIS — F17210 Nicotine dependence, cigarettes, uncomplicated: Secondary | ICD-10-CM | POA: Insufficient documentation

## 2016-11-22 DIAGNOSIS — Z3A08 8 weeks gestation of pregnancy: Secondary | ICD-10-CM | POA: Diagnosis not present

## 2016-11-22 DIAGNOSIS — O209 Hemorrhage in early pregnancy, unspecified: Secondary | ICD-10-CM | POA: Diagnosis present

## 2016-11-22 LAB — WET PREP, GENITAL
Clue Cells Wet Prep HPF POC: NONE SEEN
SPERM: NONE SEEN
Trich, Wet Prep: NONE SEEN
YEAST WET PREP: NONE SEEN

## 2016-11-22 LAB — URINALYSIS, COMPLETE (UACMP) WITH MICROSCOPIC
BILIRUBIN URINE: NEGATIVE
GLUCOSE, UA: NEGATIVE mg/dL
Ketones, ur: NEGATIVE mg/dL
Leukocytes, UA: NEGATIVE
NITRITE: NEGATIVE
PROTEIN: NEGATIVE mg/dL
RBC / HPF: NONE SEEN RBC/hpf (ref 0–5)
SPECIFIC GRAVITY, URINE: 1 — AB (ref 1.005–1.030)
Squamous Epithelial / LPF: NONE SEEN
WBC UA: NONE SEEN WBC/hpf (ref 0–5)
pH: 7 (ref 5.0–8.0)

## 2016-11-22 LAB — CHLAMYDIA/NGC RT PCR (ARMC ONLY)
CHLAMYDIA TR: NOT DETECTED
N GONORRHOEAE: NOT DETECTED

## 2016-11-22 LAB — ABO/RH: ABO/RH(D): O NEG

## 2016-11-22 LAB — HCG, QUANTITATIVE, PREGNANCY: hCG, Beta Chain, Quant, S: 268540 m[IU]/mL — ABNORMAL HIGH (ref <5)

## 2016-11-22 MED ORDER — ACETAMINOPHEN 325 MG PO TABS
650.0000 mg | ORAL_TABLET | Freq: Once | ORAL | Status: AC
  Administered 2016-11-22: 650 mg via ORAL
  Filled 2016-11-22: qty 2

## 2016-11-22 NOTE — ED Provider Notes (Signed)
[facility] Emergency Department Provider Note  ____________________________________________   First MD Initiated Contact with Patient 11/22/16 1531     (approximate)  I have reviewed the triage vital signs and the nursing notes.   HISTORY  Chief Complaint Vaginal Bleeding   HPI Dominique Frost is a 26 y.o. female with a history of PTSD as well as borderline personality disorder who is presenting to the emergency department today with vaginal bleeding. She says that she has also had a recent episode of vaginal bleeding about 2 weeks ago. She was given Rogan at the time and had an auction which showed a single IUP. Today she says she had a quarter size clot as well as having some intermittent bleeding throughout the day after the clot. She is complaining of also suprapubic pain which is mild and cramping. Also radiating to her left flank. Also with burning with urination and also started this morning.   Past Medical History:  Diagnosis Date  . Borderline personality disorder   . Depression   . Generalized anxiety disorder   . PTSD (post-traumatic stress disorder)     Patient Active Problem List   Diagnosis Date Noted  . SVD (spontaneous vaginal delivery) 04/21/2015  . Drug abuse, opioid type 08/05/2014  . Alcohol addiction (HCC) 03/11/2014  . Benzodiazepine dependence (HCC) 03/11/2014  . 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 Borderline personality disorder 10/30/2013  . Cannabis abuse, continuous 10/30/2013  . Neurosis, posttraumatic 10/30/2013  . Compulsive tobacco user syndrome 09/20/2012  . 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 DILATION AND CURETTAGE OF UTERUS      Prior to Admission medications   Medication Sig Start Date End Date Taking? Authorizing Provider  ondansetron (ZOFRAN) 4 MG tablet Take 1 tablet (4 mg total) by  mouth every 6 (six) hours. 11/13/16   Arnaldo Natal, MD  oseltamivir (TAMIFLU) 75 MG capsule Take 1 capsule (75 mg total) by mouth every 12 (twelve) hours. 11/13/16   Arnaldo Natal, MD  Prenatal Multivit-Min-Fe-FA (PRENATAL VITAMINS PO) Take by mouth.    Historical Provider, MD  sertraline (ZOLOFT) 100 MG tablet Take 100 mg by mouth daily.    Historical Provider, MD    Allergies Clonidine and Vancomycin  Family History  Problem Relation Age of Onset  . Cancer Neg Hx   . Diabetes Neg Hx   . Heart disease Neg Hx     Social History Social History  Substance Use Topics  . Smoking status: Heavy Tobacco Smoker    Packs/day: 0.50    Years: 10.00    Types: Cigarettes  . Smokeless tobacco: Never Used  . Alcohol use No    Review of Systems Constitutional: No fever/chills Eyes: No visual changes. ENT: No sore throat. Cardiovascular: Denies chest pain. Respiratory: Denies shortness of breath. Gastrointestinal: No nausea, no vomiting.  No diarrhea.  No constipation. Genitourinary: Negative for dysuria. Musculoskeletal: as above Skin: Negative for rash. Neurological: Negative for headaches, focal weakness or numbness.  10-point ROS otherwise negative.  ____________________________________________   PHYSICAL EXAM:  VITAL SIGNS: ED Triage Vitals  Enc Vitals Group     BP 11/22/16 1500 108/80     Pulse Rate 11/22/16 1500 75     Resp 11/22/16 1500 18     Temp 11/22/16 1500 98.5 F (36.9 C)     Temp Source 11/22/16 1500 Oral  SpO2 11/22/16 1500 100 %     Weight 11/22/16 1501 136 lb (61.7 kg)     Height 11/22/16 1501 5\' 3"  (1.6 m)     Head Circumference --      Peak Flow --      Pain Score 11/22/16 1501 5     Pain Loc --      Pain Edu? --      Excl. in GC? --     Constitutional: Alert and oriented. Well appearing and in no acute distress. Eyes: Conjunctivae are normal. PERRL. EOMI. Head: Atraumatic. Nose: No congestion/rhinnorhea. Mouth/Throat: Mucous  membranes are moist.   Neck: No stridor.   Cardiovascular: Normal rate, regular rhythm. Grossly normal heart sounds.   Respiratory: Normal respiratory effort.  No retractions. Lungs CTAB. Gastrointestinal: Soft and nontender. No distention.No CVA tenderness. Genitourinary:  Normal external exam. Speculum exam with a minimal amount of maroon blood. No active bleeding from the cervix. No pooling of blood in the vagina. Bimanual exam with a closed cervical os. No CMT. Mild uterine tenderness palpation without any adnexal masses or tenderness to palpation. Musculoskeletal: No lower extremity tenderness nor edema.  No joint effusions. Neurologic:  Normal speech and language. No gross focal neurologic deficits are appreciated. No gait instability. Skin:  Skin is warm, dry and intact. No rash noted. Psychiatric: Mood and affect are normal. Speech and behavior are normal.  ____________________________________________   LABS (all labs ordered are listed, but only abnormal results are displayed)  Labs Reviewed  WET PREP, GENITAL - Abnormal; Notable for the following:       Result Value   WBC, Wet Prep HPF POC RARE (*)    All other components within normal limits  HCG, QUANTITATIVE, PREGNANCY - Abnormal; Notable for the following:    hCG, Beta Chain, [place] LongestQuant, S [id number] (*)    All other components within normal limits  URINALYSIS, COMPLETE (UACMP) WITH MICROSCOPIC - Abnormal; Notable for the following:    Color, Urine COLORLESS (*)    APPearance CLEAR (*)    Specific Gravity, Urine 1.000 (*)    Hgb urine dipstick LARGE (*)    Bacteria, UA RARE (*)    All other components within normal limits  CHLAMYDIA/NGC RT PCR ([facility] ONLY)  ABO/RH   ____________________________________________  EKG   ____________________________________________  RADIOLOGY  US OB Transvaginal (Final result)  Result time 11/22/16 17:31:04  Final result by Sebastian AcheAllen Grady, MD (11/22/16 17:31:04)             Narrative:   CLINICAL DATA: Low back pain and vaginal bleeding for 1 day.  EXAM: OBSTETRIC <14 WK US AND TRANSVAGINAL OB US  TECHNIQUE: Both transabdominal and transvaginal ultrasound examinations were performed for complete evaluation of the gestation as well as the maternal uterus, adnexal regions, and pelvic cul-de-sac. Transvaginal technique was performed to assess early pregnancy.  COMPARISON: 11/09/2016  FINDINGS: Intrauterine gestational sac: Single.  Yolk sac: Present.  Embryo: Present. Small amount of echogenic material ventral to the fetal abdomen, incompletely evaluated on this emergent first trimester ultrasound though could reflect normal embryological herniation of bowel. Ultrasound later in pregnancy can further evaluate this and other fetal anatomy.  Cardiac Activity: Present  Heart Rate: 163 bpm  CRL: 20.2 mm  8 w  4 d         US EDC: 06/30/2017  Subchorionic hemorrhage: Small.  Maternal uterus/adnexae: 3.7 cm intramural uterine fibroid as previously seen. Normal appearance of the right ovary. Left ovary not visualized due  to bowel.  IMPRESSION: Single living intrauterine pregnancy as above.   Electronically Signed By: Sebastian Ache M.D. On: 11/22/2016 17:31            US OB Comp Less 14 Wks (Final result)  Result time 11/22/16 17:31:04  Final result by Sebastian Ache, MD (11/22/16 17:31:04)           Narrative:   CLINICAL DATA: Low back pain and vaginal bleeding for 1 day.  EXAM: OBSTETRIC <14 WK [place] AND TRANSVAGINAL OB US  TECHNIQUE: Both transabdominal and transvaginal ultrasound examinations were performed for complete evaluation of the gestation as well as the maternal uterus, adnexal regions, and pelvic cul-de-sac. Transvaginal technique was performed to assess early pregnancy.  COMPARISON: 11/09/2016  FINDINGS: Intrauterine gestational sac: Single.  Yolk sac: Present.  Embryo: Present. Small  amount of echogenic material ventral to the fetal abdomen, incompletely evaluated on this emergent first trimester ultrasound though could reflect normal embryological herniation of bowel. Ultrasound later in pregnancy can further evaluate this and other fetal anatomy.  Cardiac Activity: Present  Heart Rate: 163 bpm  CRL: 20.2 mm  8 w  4 d         [place] EDC: 06/30/2017  Subchorionic hemorrhage: Small.  Maternal uterus/adnexae: 3.7 cm intramural uterine fibroid as previously seen. Normal appearance of the right ovary. Left ovary not visualized due to bowel.  IMPRESSION: Single living intrauterine pregnancy as above.   Electronically Signed By: Sebastian Ache M.D. On: 11/22/2016 17:31            ____________________________________________   PROCEDURES  Procedure(s) performed:   Procedures  Critical Care performed:   ____________________________________________   INITIAL IMPRESSION / ASSESSMENT AND PLAN / ED COURSE  Pertinent labs & imaging results that were available during my care of the patient were reviewed by me and considered in my medical decision making (see chart for details).  ----------------------------------------- 5:51 PM on 11/22/2016 -----------------------------------------  Patient with recent dose of RhoGAM. Discussed case Dr. Jean Rosenthal OB/GYN this is the patient should not need another dose. Patient with likely subchorionic hemorrhage causing this issue. We discussed pelvic rest as well as continuing with her prenatal vitamin and following up with her OB/GYN. She is understanding the plan and willing to comply.      ____________________________________________   FINAL CLINICAL IMPRESSION(S) / ED DIAGNOSES  Threatened abortion.    NEW MEDICATIONS STARTED DURING THIS VISIT:  New Prescriptions   No medications on file     Note:  This document was prepared using Dragon voice recognition software and may include  unintentional dictation errors.    Myrna Blazer, MD 11/22/16 [phone]

## 2016-11-22 NOTE — ED Triage Notes (Signed)
Pt reports that she is having a miscarriage and passed a large clot PTA. Pt alert and oriented X4, active, cooperative, pt in NAD. RR even and unlabored, color WNL.

## 2016-11-22 NOTE — ED Notes (Signed)
Patient transported to Ultrasound 

## 2016-11-22 NOTE — ED Notes (Signed)
Pt states this AM she had some heavy vaginal bleeding and passed a clot, at present states lower back pain, pt awake and alert, states she believed she was about [redacted] weeks pregnant

## 2016-12-11 ENCOUNTER — Ambulatory Visit: Admission: RE | Admit: 2016-12-11 | Payer: Medicaid Other | Source: Ambulatory Visit

## 2016-12-21 ENCOUNTER — Inpatient Hospital Stay: Admission: RE | Admit: 2016-12-21 | Payer: Medicaid Other | Source: Ambulatory Visit

## 2017-02-01 ENCOUNTER — Ambulatory Visit: Payer: Medicaid Other

## 2017-02-02 ENCOUNTER — Ambulatory Visit
Admission: EM | Admit: 2017-02-02 | Discharge: 2017-02-02 | Disposition: A | Discharge status: Home / Self Care | Payer: Medicaid Other | Attending: Emergency Medicine | Admitting: Emergency Medicine

## 2017-02-02 ENCOUNTER — Encounter: Payer: Self-pay | Admitting: Emergency Medicine

## 2017-02-02 DIAGNOSIS — K0889 Other specified disorders of teeth and supporting structures: Secondary | ICD-10-CM

## 2017-02-02 MED ORDER — ACETAMINOPHEN-CODEINE #3 300-30 MG PO TABS: 1.0000 | ORAL_TABLET | Freq: Four times a day (QID) | ORAL | 0 refills | Status: DC | PRN

## 2017-02-02 MED ORDER — CHLORHEXIDINE GLUCONATE 0.12 % MT SOLN: OROMUCOSAL | 0 refills | Status: DC

## 2017-02-02 MED ORDER — PENICILLIN V POTASSIUM 500 MG PO TABS: 500.0000 mg | ORAL_TABLET | Freq: Four times a day (QID) | ORAL | 0 refills | Status: AC

## 2017-02-02 NOTE — Discharge Instructions (Signed)
Continue peroxide and salt water rinses. If the Peridex is too expensive, then you may use Listerine. Take 1 g of Tylenol up to 4 times a day as needed for pain. 4, you may take the Tylenol 3. Do not take Tylenol and Tylenol 3. Do not exceed 4 g of Tylenol from all sources in one day. Call your dentist today and try and make an appointment for Monday or Tuesday. Go to the ER for the signs and symptoms we discussed.

## 2017-02-02 NOTE — ED Triage Notes (Signed)
Patient c/o dental pain on her top teeth that started off and on for a month.  Patient reports that she had her tooth extracted two days ago.

## 2017-02-02 NOTE — ED Provider Notes (Signed)
HPI  SUBJECTIVE:  Dominique Frost is a 26 y.o. female who presents with dull, constant, throbbing pain in the area where she had a left upper molar extracted 2 days ago. She reports gingival swelling and new greenish drainage. She reports subjective facial swelling and fevers Tmax 102. She has been taking ibuprofen and Excedrin without improvement in her symptoms. She is also tried warm compresses, cold Compresses, peroxide and salt water rinses. No alleviating factors. Symptoms are worse with talking, eating, exposure to air or change in temperature. She is a G5 P3, she states that she is 5 months pregnant. By my calculations she is 19 0/7 days by LMP. States that she is feeling baby move, she denies contractions, vaginal bleeding or leakage of fluid.  Patient has a past medical history of alcohol addiction, benzodiazepine dependence, opiate -type drug abuse in her problem list. She states that she had a cocaine problem but denies alcohol or opiate abuse. States that she has been clean for a year. Patient is a smoker. No history of diabetes, hypertension. LMP: 12/22. Patient has [facility] prenatal care at [facility] and has had an ultrasound confirming a live IUP  Past Medical History:  Diagnosis Date  . Borderline personality disorder   . Depression   . Generalized anxiety disorder   . PTSD (post-traumatic stress disorder)     Past Surgical History:  Procedure Laterality Date  . DILATION AND CURETTAGE OF UTERUS      Family History  Problem Relation Age of Onset  . Cancer Neg Hx   . Diabetes Neg Hx   . Heart disease Neg Hx     Social History  Substance Use Topics  . Smoking status: Heavy Tobacco Smoker    Packs/day: 0.50    Years: 10.00    Types: Cigarettes  . Smokeless tobacco: Never Used  . Alcohol use No    No current facility-administered medications for this encounter.   Current Outpatient Prescriptions:  .  ALPRAZolam (XANAX) 1 MG tablet, Take 1 mg by mouth 2  (two) times daily as needed for anxiety., Disp: , Rfl:  .  acetaminophen-codeine (TYLENOL #3) 300-30 MG tablet, Take 1-2 tablets by mouth every 6 (six) hours as needed for moderate pain., Disp: 10 tablet, Rfl: 0 .  chlorhexidine (PERIDEX) 0.12 % solution, 15 mL swish and spit bid, Disp: 480 mL, Rfl: 0 .  ondansetron (ZOFRAN) 4 MG tablet, Take 1 tablet (4 mg total) by mouth every 6 (six) hours., Disp: 60 tablet, Rfl: 1 .  penicillin v potassium (VEETID) 500 MG tablet, Take 1 tablet (500 mg total) by mouth 4 (four) times daily., Disp: 28 tablet, Rfl: 0 .  Prenatal Multivit-Min-Fe-FA (PRENATAL VITAMINS PO), Take by mouth., Disp: , Rfl:  .  sertraline (ZOLOFT) 100 MG tablet, Take 100 mg by mouth daily., Disp: , Rfl:   Allergies  Allergen Reactions  . Clonidine     Other reaction(s): RASH  . Vancomycin     Other reaction(s): RASH     ROS  As noted in HPI.   Physical Exam  BP 115/60 (BP Location: Left Arm)   Pulse 98   Temp 98.7 F (37.1 C) (Oral)   Resp 16   Ht 5\' 3"  (1.6 m)   Wt 140 lb (63.5 kg)   LMP 09/08/2016 (Approximate)   SpO2 97%   Breastfeeding? No   BMI 24.80 kg/m   Constitutional: Well developed, well nourished, Appears uncomfortable Eyes:  EOMI, conjunctiva normal bilaterally HENT: Normocephalic,  atraumatic,mucus membranes moist. Tooth #2, second left upper molar surgically absent. Unable to fully visualize the surgical site, but the area is tender to palpation with gingival swelling. No appreciable drainage. Mild trismus. No facial swelling. Lymph: No auricular, cervical lymphadenopathy but she does have tenderness in the cervical region. Respiratory: Normal inspiratory effort Cardiovascular: Normal rate GI: nondistended skin: No rash, skin intact Musculoskeletal: no deformities Neurologic: Alert & oriented x 3, no focal neuro deficits Psychiatric: Speech and behavior appropriate   ED Course   Medications - No data to display  Orders Placed This Encounter   Procedures  . Assess fetal heart tones    Standing Status:   Standing    Number of Occurrences:   1    No results found for this or any previous visit (from the past 24 hour(s)). No results found.  ED Clinical Impression  Pain, dental  ED Assessment/Plan  [facility] [place] [facility]. One prescription for Cheratussin cough syrup in January. No other opiates or controlled substance prescriptions.  Fetal heart tones 145  Patient declined a dental block. This could either be a gingival infection versus dry socket. It is difficult to tell based on exam, as I was unable to fully visualize the surgical site. We'll send home with penicillin, Peridex, she is to continue the peroxide and salt water rinses, Tylenol 1 g up to 4 times a day, or she may take Tylenol No. 3. Dispensing 10. She is to follow-up with her dentist, Dr. Malen GauzeBlankenship on Monday. discussed with her that if this is dry socket, pain medicine will not help until she has the area filled. Discussed  MDM, plan and followup with patient. Discussed sn/sx that should prompt return to the ED. Patient agrees with plan.   Meds ordered this encounter  Medications  . ALPRAZolam (XANAX) 1 MG tablet    Sig: Take 1 mg by mouth 2 (two) times daily as needed for anxiety.  . penicillin v potassium (VEETID) 500 MG tablet    Sig: Take 1 tablet (500 mg total) by mouth 4 (four) times daily.    Dispense:  28 tablet    Refill:  0  . chlorhexidine (PERIDEX) 0.12 % solution    Sig: 15 mL swish and spit bid    Dispense:  480 mL    Refill:  0  . acetaminophen-codeine (TYLENOL #3) 300-30 MG tablet    Sig: Take 1-2 tablets by mouth every 6 (six) hours as needed for moderate pain.    Dispense:  10 tablet    Refill:  0    *This clinic note was created using Scientist, clinical (histocompatibility and immunogenetics)Dragon dictation software. Therefore, there may be occasional mistakes despite careful proofreading.  ?   Domenick GongAshley Keatyn Luck, MD 02/02/17 1431

## 2017-02-08 ENCOUNTER — Institutional Professional Consult (permissible substitution): Payer: Medicaid Other

## 2017-03-08 ENCOUNTER — Encounter: Payer: Self-pay | Admitting: *Deleted

## 2017-03-08 ENCOUNTER — Ambulatory Visit
Admission: RE | Admit: 2017-03-08 | Discharge: 2017-03-08 | Disposition: A | Discharge status: Home / Self Care | Payer: Medicaid Other | Source: Ambulatory Visit | Attending: Maternal and Fetal Medicine | Admitting: Maternal and Fetal Medicine

## 2017-03-08 DIAGNOSIS — F1120 Opioid dependence, uncomplicated: Secondary | ICD-10-CM

## 2017-03-08 NOTE — Progress Notes (Signed)
[facility] Consultation   Chief Complaint: Narcotic dependence in pregnancy  HPI: Dominique Frost is a 26 y.o. [id number] at 5864w6d by first trimester US who [facility] for [facility] dependence  Past Medical History: Patient  has a past medical history of Borderline personality disorder; Depression; Generalized anxiety disorder; and PTSD (post-traumatic stress disorder).  Past Surgical History: She  has a past surgical history that includes Dilation and curettage of uterus.  Obstetric History:  OB History    Gravida Para Term Preterm AB Living   5 3 3   1 3    SAB TAB Ectopic Multiple Live Births   1     0 1      Obstetric Comments   D&C after SAB     OB history: (per patient) First pregnancy (unknown date): ? Partial mole- D&C at "4 months" 2012 40 weeks, can't recall if labor or IOL 2013 37 weeks FGR IOL 5# 2016 37 weeks FGR IOL 4.5#   Medications: She takes methadone 60 mg daily, zoloft 150mg , PNV Allergies: Patient is allergic to clonidine and vancomycin. Vanco causes red man syndrome Social History: Patient  reports that she has been smoking Cigarettes.  She has a 5.00 pack-year smoking history. She has never used smokeless tobacco. She reports that she does not drink alcohol or use drugs.  Family History: family history is not on file.  Review of Systems A full 12 point review of systems was negative or as noted in the History of Present Illness.  Physical Exam: BP (!) 122/56 (BP Location: Right Arm)   Pulse 90   Temp 98.6 F (37 C) (Oral)   Resp 18   Wt 138 lb 3.2 oz (62.7 kg)   LMP 09/22/2016   SpO2 97%   BMI 24.48 kg/m    Asessement: Narcotic dependence during pregnancy Plan: 1. We discussed the risks/benefits/alternatives to methadone use during pregnancy and the rationale for continued use during pregnancy. We discussed the risk of neonatal abstinence syndrome and the fact that the infant should be monitored  (likely in-house) for 5 days for evidence of withdrawal. We discussed the fact that perhaps the best treatment is low lighting, low stimulation, swaddling, and skin to skin contact for the infant. It will be important that there be a physical space for the patient to be able to do skin to skin during this timeframe. We also discussed that medication therapy may be needed. The patient expressed understanding. 2. Previous FGR. We discussed the recurrence risk of FGR. We have arranged a growth scan for this pregnancy and, if normal, serial scans may be performed monthly until delivery.    Total time spent with the patient was 30 minutes with greater than 50% spent in counseling and coordination of care. We appreciate this interesting consult and will be happy to be involved in the ongoing care of Dominique Frost in anyway her obstetricians desire.  Artemio AlyBrenna Emanuella Nickle, MD Maternal-Fetal Medicine [facility]

## 2017-03-15 ENCOUNTER — Other Ambulatory Visit: Payer: Self-pay | Admitting: *Deleted

## 2017-03-15 DIAGNOSIS — O99320 Drug use complicating pregnancy, unspecified trimester: Principal | ICD-10-CM

## 2017-03-15 DIAGNOSIS — F1920 Other psychoactive substance dependence, uncomplicated: Secondary | ICD-10-CM

## 2017-03-19 ENCOUNTER — Ambulatory Visit: Payer: Medicaid Other

## 2017-03-22 ENCOUNTER — Inpatient Hospital Stay: Admission: RE | Admit: 2017-03-22 | Payer: Medicaid Other | Source: Ambulatory Visit

## 2017-03-24 ENCOUNTER — Observation Stay
Admission: EM | Admit: 2017-03-24 | Discharge: 2017-03-24 | Disposition: A | Discharge status: Home / Self Care | Payer: Medicaid Other | Attending: Obstetrics & Gynecology | Admitting: Obstetrics & Gynecology

## 2017-03-24 DIAGNOSIS — Z888 Allergy status to other drugs, medicaments and biological substances status: Secondary | ICD-10-CM | POA: Diagnosis not present

## 2017-03-24 DIAGNOSIS — Z3A26 26 weeks gestation of pregnancy: Secondary | ICD-10-CM | POA: Insufficient documentation

## 2017-03-24 DIAGNOSIS — O99342 Other mental disorders complicating pregnancy, second trimester: Secondary | ICD-10-CM | POA: Insufficient documentation

## 2017-03-24 DIAGNOSIS — Y92512 Supermarket, store or market as the place of occurrence of the external cause: Secondary | ICD-10-CM | POA: Diagnosis not present

## 2017-03-24 DIAGNOSIS — W1839XA Other fall on same level, initial encounter: Secondary | ICD-10-CM | POA: Diagnosis not present

## 2017-03-24 DIAGNOSIS — Z881 Allergy status to other antibiotic agents status: Secondary | ICD-10-CM | POA: Insufficient documentation

## 2017-03-24 DIAGNOSIS — Y9389 Activity, other specified: Secondary | ICD-10-CM | POA: Diagnosis not present

## 2017-03-24 DIAGNOSIS — F1120 Opioid dependence, uncomplicated: Secondary | ICD-10-CM | POA: Insufficient documentation

## 2017-03-24 DIAGNOSIS — Y998 Other external cause status: Secondary | ICD-10-CM | POA: Diagnosis not present

## 2017-03-24 DIAGNOSIS — O26892 Other specified pregnancy related conditions, second trimester: Secondary | ICD-10-CM | POA: Diagnosis present

## 2017-03-24 DIAGNOSIS — F329 Major depressive disorder, single episode, unspecified: Secondary | ICD-10-CM | POA: Diagnosis not present

## 2017-03-24 DIAGNOSIS — M533 Sacrococcygeal disorders, not elsewhere classified: Secondary | ICD-10-CM | POA: Insufficient documentation

## 2017-03-24 DIAGNOSIS — O99332 Smoking (tobacco) complicating pregnancy, second trimester: Secondary | ICD-10-CM | POA: Insufficient documentation

## 2017-03-24 DIAGNOSIS — F4310 Post-traumatic stress disorder, unspecified: Secondary | ICD-10-CM | POA: Insufficient documentation

## 2017-03-24 DIAGNOSIS — F17210 Nicotine dependence, cigarettes, uncomplicated: Secondary | ICD-10-CM | POA: Diagnosis not present

## 2017-03-24 DIAGNOSIS — F603 Borderline personality disorder: Secondary | ICD-10-CM | POA: Insufficient documentation

## 2017-03-24 MED ORDER — ACETAMINOPHEN 500 MG PO TABS
1000.0000 mg | ORAL_TABLET | Freq: Once | ORAL | Status: AC
  Administered 2017-03-24: 1000 mg via ORAL

## 2017-03-24 MED ORDER — ACETAMINOPHEN 500 MG PO TABS
ORAL_TABLET | ORAL | Status: AC
  Administered 2017-03-24: 1000 mg via ORAL
  Filled 2017-03-24: qty 2

## 2017-03-24 NOTE — Discharge Summary (Addendum)
Dominique Frost is a 26 y.o. female. She is at 4934w1d gestation. Patient's last menstrual period was 09/22/2016. Estimated Date of Delivery: 06/29/17  [facility]   Chief complaint: fall  Location: rear end/tail bone Onset/timing: sudden Duration: 2 hours Quality: ache Severity: mild to moderate Aggravating or alleviating conditions: none Associated signs/symptoms: no CTX, no VB.no LOF,  Active fetal movement Context: Patient was at the mall trying on stilettos and lost her balance.  She fell directly on her tailbone.     Maternal Medical History:   Past Medical History:  Diagnosis Date  . Borderline personality disorder   . Depression   . Generalized anxiety disorder   . PTSD (post-traumatic stress disorder)     Past Surgical History:  Procedure Laterality Date  . DILATION AND CURETTAGE OF UTERUS      Allergies  Allergen Reactions  . Clonidine     Other reaction(s): RASH  . Vancomycin     Other reaction(s): RASH    Prior to Admission medications   Medication Sig Start Date End Date Taking? Authorizing Provider  methadone (DOLOPHINE) 10 MG/ML solution Take 60 mg by mouth 1 day or 1 dose.   Yes [provider]  Prenatal Multivit-Min-Fe-FA (PRENATAL VITAMINS PO) Take by mouth.   Yes [provider]  sertraline (ZOLOFT) 100 MG tablet Take 100 mg by mouth daily.   Yes [provider]     Social History: She  reports that she has been smoking Cigarettes.  She has a 5.00 pack-year smoking history. She has never used smokeless tobacco. She reports that she does not drink alcohol or use drugs.  Family History:  no history of gyn cancers  Review of Systems: A full review of systems was performed and negative except as noted in the HPI.     O:  BP 121/61   Pulse 82   LMP 09/22/2016  No results found for this or any previous visit (from the past 48 hour(s)).   Constitutional: NAD, AAOx3  HE/ENT: extraocular  movements grossly intact, moist mucous membranes CV: RRR PULM: nl respiratory effort, CTABL     Abd: gravid, non-tender, non-distended, soft      Ext: Non-tender, Nonedmeatous   Psych: mood appropriate, speech normal Pelvic deferred Normal gait, ROM of LE's  NST:  Baseline: 140 Variability: moderate Accelerations present x >2 Decelerations absent Time 20mins    A/P: 26 y.o. 7134w1d here for evaluation after fall.  Patient requested to be discharged after an hour of observation.    Fetal Wellbeing: Reassuring Cat 1 tracing.  Reactive NST   Labor: not present.   D/c home stable, precautions reviewed, follow-up as scheduled.   ----- Ranae Plumberhelsea Ward, MD Attending Obstetrician and Gynecologist [facility], Department of OB[facility]GYN [facility]

## 2017-03-24 NOTE — OB Triage Note (Signed)
Patient presents to triage for a fall earlier today, she feel straight onto her bottom and has been having hip, pelvic pain since. Baby is moving well, no bleeding or gushes of fluid

## 2017-04-25 ENCOUNTER — Observation Stay
Admission: EM | Admit: 2017-04-25 | Discharge: 2017-04-25 | Disposition: A | Discharge status: Home / Self Care | Payer: Medicaid Other | Attending: Obstetrics and Gynecology | Admitting: Obstetrics and Gynecology

## 2017-04-25 DIAGNOSIS — O4703 False labor before 37 completed weeks of gestation, third trimester: Secondary | ICD-10-CM | POA: Diagnosis present

## 2017-04-25 DIAGNOSIS — Z3A33 33 weeks gestation of pregnancy: Secondary | ICD-10-CM | POA: Diagnosis not present

## 2017-04-25 DIAGNOSIS — Z3490 Encounter for supervision of normal pregnancy, unspecified, unspecified trimester: Secondary | ICD-10-CM

## 2017-04-25 MED ORDER — TERBUTALINE SULFATE 1 MG/ML IJ SOLN
0.2500 mg | Freq: Once | INTRAMUSCULAR | Status: AC
  Administered 2017-04-25: 0.25 mg via SUBCUTANEOUS

## 2017-04-25 MED ORDER — TERBUTALINE SULFATE 1 MG/ML IJ SOLN
INTRAMUSCULAR | Status: AC
  Filled 2017-04-25: qty 1

## 2017-05-11 NOTE — Progress Notes (Signed)
Patient ID: Dominique Frost, female   DOB: 1991-09-07, 26 y.o.   MRN: [id number] Pt to OBS 2 with c/o contractions every 2 minutes.  Pt to change into hospital gown.  [id number] RN to room EFM explained and applied.  Pt c/o contractions and stated she would like to see the doctor and get some pain meds.  no labor  D/c home

## 2017-05-11 NOTE — Discharge Summary (Signed)
   Rhude, Ihor Austinhomas J, MD  Obstetrics    [] Hide copied text Patient ID: Dominique Frost, female   DOB: 1991-01-04, 26 y.o.   MRN: [id number] Pt to OBS 2 with c/o contractions every 2 minutes. Pt to change into hospital gown.  [id number] RN to room EFM explained and applied. Pt c/o contractions and stated she would like to see the doctor and get some pain meds.  no labor  D/c home

## 2017-06-22 ENCOUNTER — Inpatient Hospital Stay: Payer: Medicaid Other | Admitting: Anesthesiology

## 2017-06-22 ENCOUNTER — Inpatient Hospital Stay
Admission: EM | Admit: 2017-06-22 | Discharge: 2017-06-25 | DRG: 775 | Disposition: A | Discharge status: Home / Self Care | Payer: Medicaid Other | Attending: Obstetrics and Gynecology | Admitting: Obstetrics and Gynecology

## 2017-06-22 DIAGNOSIS — O99324 Drug use complicating childbirth: Secondary | ICD-10-CM | POA: Diagnosis present

## 2017-06-22 DIAGNOSIS — F4323 Adjustment disorder with mixed anxiety and depressed mood: Secondary | ICD-10-CM

## 2017-06-22 DIAGNOSIS — F1110 Opioid abuse, uncomplicated: Secondary | ICD-10-CM | POA: Diagnosis present

## 2017-06-22 DIAGNOSIS — O99334 Smoking (tobacco) complicating childbirth: Secondary | ICD-10-CM | POA: Diagnosis present

## 2017-06-22 DIAGNOSIS — F17210 Nicotine dependence, cigarettes, uncomplicated: Secondary | ICD-10-CM | POA: Diagnosis present

## 2017-06-22 DIAGNOSIS — F4310 Post-traumatic stress disorder, unspecified: Secondary | ICD-10-CM | POA: Diagnosis present

## 2017-06-22 DIAGNOSIS — F1320 Sedative, hypnotic or anxiolytic dependence, uncomplicated: Secondary | ICD-10-CM | POA: Diagnosis present

## 2017-06-22 DIAGNOSIS — O99344 Other mental disorders complicating childbirth: Secondary | ICD-10-CM | POA: Diagnosis present

## 2017-06-22 DIAGNOSIS — F1210 Cannabis abuse, uncomplicated: Secondary | ICD-10-CM | POA: Diagnosis present

## 2017-06-22 DIAGNOSIS — F411 Generalized anxiety disorder: Secondary | ICD-10-CM | POA: Diagnosis present

## 2017-06-22 DIAGNOSIS — F1120 Opioid dependence, uncomplicated: Secondary | ICD-10-CM

## 2017-06-22 DIAGNOSIS — Z3490 Encounter for supervision of normal pregnancy, unspecified, unspecified trimester: Secondary | ICD-10-CM | POA: Diagnosis present

## 2017-06-22 DIAGNOSIS — O99314 Alcohol use complicating childbirth: Secondary | ICD-10-CM | POA: Diagnosis present

## 2017-06-22 DIAGNOSIS — Z3A39 39 weeks gestation of pregnancy: Secondary | ICD-10-CM

## 2017-06-22 DIAGNOSIS — D649 Anemia, unspecified: Secondary | ICD-10-CM | POA: Diagnosis present

## 2017-06-22 DIAGNOSIS — F603 Borderline personality disorder: Secondary | ICD-10-CM | POA: Diagnosis present

## 2017-06-22 DIAGNOSIS — O26893 Other specified pregnancy related conditions, third trimester: Secondary | ICD-10-CM | POA: Diagnosis present

## 2017-06-22 DIAGNOSIS — O9902 Anemia complicating childbirth: Secondary | ICD-10-CM | POA: Diagnosis present

## 2017-06-22 LAB — CBC
HEMATOCRIT: 33.9 % — AB (ref 35.0–47.0)
HEMOGLOBIN: 11.6 g/dL — AB (ref 12.0–16.0)
MCH: 30.5 pg (ref 26.0–34.0)
MCHC: 34.3 g/dL (ref 32.0–36.0)
MCV: 88.9 fL (ref 80.0–100.0)
Platelets: 176 10*3/uL (ref 150–440)
RBC: 3.82 MIL/uL (ref 3.80–5.20)
RDW: 13.4 % (ref 11.5–14.5)
WBC: 16.6 10*3/uL — ABNORMAL HIGH (ref 3.6–11.0)

## 2017-06-22 MED ORDER — OXYTOCIN 40 UNITS IN LACTATED RINGERS INFUSION - SIMPLE MED
1.0000 m[IU]/min | INTRAVENOUS | Status: DC
  Administered 2017-06-22: 2 m[IU]/min via INTRAVENOUS
  Filled 2017-06-22: qty 1000

## 2017-06-22 MED ORDER — OXYTOCIN BOLUS FROM INFUSION
500.0000 mL | Freq: Once | INTRAVENOUS | Status: AC
  Administered 2017-06-23: 500 mL via INTRAVENOUS

## 2017-06-22 MED ORDER — LACTATED RINGERS IV SOLN: 500.0000 mL | INTRAVENOUS | Status: DC | PRN

## 2017-06-22 MED ORDER — SOD CITRATE-CITRIC ACID 500-334 MG/5ML PO SOLN: 30.0000 mL | ORAL | Status: DC | PRN

## 2017-06-22 MED ORDER — LACTATED RINGERS IV SOLN
INTRAVENOUS | Status: DC
  Administered 2017-06-22 – 2017-06-23 (×2): via INTRAVENOUS

## 2017-06-22 MED ORDER — AMMONIA AROMATIC IN INHA
RESPIRATORY_TRACT | Status: AC
  Filled 2017-06-22: qty 10

## 2017-06-22 MED ORDER — LIDOCAINE HCL (PF) 1 % IJ SOLN: 30.0000 mL | INTRAMUSCULAR | Status: DC | PRN

## 2017-06-22 MED ORDER — LIDOCAINE HCL (PF) 1 % IJ SOLN
INTRAMUSCULAR | Status: DC | PRN
  Administered 2017-06-22: 3 mL

## 2017-06-22 MED ORDER — OXYTOCIN 40 UNITS IN LACTATED RINGERS INFUSION - SIMPLE MED
INTRAVENOUS | Status: AC
  Administered 2017-06-22: 2 m[IU]/min via INTRAVENOUS
  Filled 2017-06-22: qty 1000

## 2017-06-22 MED ORDER — OXYTOCIN 40 UNITS IN LACTATED RINGERS INFUSION - SIMPLE MED: 2.5000 [IU]/h | INTRAVENOUS | Status: DC

## 2017-06-22 MED ORDER — MISOPROSTOL 200 MCG PO TABS
ORAL_TABLET | ORAL | Status: AC
  Filled 2017-06-22: qty 4

## 2017-06-22 MED ORDER — OXYTOCIN 10 UNIT/ML IJ SOLN
INTRAMUSCULAR | Status: AC
  Filled 2017-06-22: qty 2

## 2017-06-22 MED ORDER — LIDOCAINE HCL (PF) 1 % IJ SOLN
INTRAMUSCULAR | Status: AC
  Filled 2017-06-22: qty 30

## 2017-06-22 MED ORDER — BUTORPHANOL TARTRATE 2 MG/ML IJ SOLN: 1.0000 mg | INTRAMUSCULAR | Status: DC | PRN

## 2017-06-22 MED ORDER — FENTANYL 2.5 MCG/ML W/ROPIVACAINE 0.15% IN NS 100 ML EPIDURAL (ARMC)
EPIDURAL | Status: AC
  Filled 2017-06-22: qty 100

## 2017-06-22 MED ORDER — BUPIVACAINE HCL (PF) 0.25 % IJ SOLN
INTRAMUSCULAR | Status: DC | PRN
  Administered 2017-06-22: 10 mL via EPIDURAL

## 2017-06-22 MED ORDER — TERBUTALINE SULFATE 1 MG/ML IJ SOLN: 0.2500 mg | Freq: Once | INTRAMUSCULAR | Status: DC | PRN

## 2017-06-22 MED ORDER — LIDOCAINE-EPINEPHRINE (PF) 1.5 %-1:200000 IJ SOLN
INTRAMUSCULAR | Status: DC | PRN
  Administered 2017-06-22: 3 mL via PERINEURAL

## 2017-06-22 MED ORDER — ACETAMINOPHEN 325 MG PO TABS: 650.0000 mg | ORAL_TABLET | ORAL | Status: DC | PRN

## 2017-06-22 MED ORDER — ONDANSETRON HCL 4 MG/2ML IJ SOLN
4.0000 mg | Freq: Four times a day (QID) | INTRAMUSCULAR | Status: DC | PRN
  Administered 2017-06-23: 4 mg via INTRAVENOUS
  Filled 2017-06-22: qty 2

## 2017-06-22 MED ORDER — MISOPROSTOL 25 MCG QUARTER TABLET: 25.0000 ug | ORAL_TABLET | ORAL | Status: DC | PRN

## 2017-06-22 NOTE — H&P (Signed)
CC: 02/18/14 Patient's last menstrual period was 09/22/2016 (exact date). Estimated Date of Delivery: 06/29/17. This is a planned pregnancy.  ALLERGIES:  Allergies as of 11/30/2016 - Reviewed 11/30/2016  Allergen Reaction Noted  . Clonidine Rash 04/04/2015  . Vancomycin Rash 04/04/2015   MEDS:  Current Outpatient Prescriptions on File Prior to Visit  Medication Sig Dispense Refill  . METHADONE HCL (METHADONE ORAL) Take 60 mg by mouth once daily.  . ondansetron (ZOFRAN) 4 MG tablet Take by mouth.  . prenatal vit-CA-MIN-FE-FA (P-D NATAL PLUS, KPN ORAL) tablet Take by mouth.  . sertraline (ZOLOFT) 100 MG tablet Take by mouth.   Past Medical History:  Diagnosis Date  . Borderline personality disorder   . Depression   . Generalized anxiety disorder   . PTSD (post-traumatic stress disorder)    Alcohol addiction (CMS-HCC)  . Benzodiazepine dependence (CMS-HCC)  . Borderline personality disorder  . Cannabis abuse, continuous  . Depression, unspecified  . Drug abuse, opioid type  . Generalized anxiety disorder  . Neurosis, posttraumatic, unspecified  . Nonpsychotic mental disorder  . Panic attacks  . Tobacco user  PAST SURGICAL HX: D&C  SOCIAL History: reports that she has been smoking Cigarettes. She has a 5.00 pack-year smoking history. She has never used smokeless tobacco. She reports that she does not drink alcohol or use drugs. Smoking 1/2 ppd x 2 months formerly 1 ppd x 5 yrs. No ETOH, MJ. Going to Methadone clinic since July 2015 due to addiction of benzodiazepines (xanax, klonopin, valium). Currently on Methadone 60 mg daily.  GENETIC HX: NO Down syndrome, No Trisomies or spinal bifida on either side of family. No twin or triplet history. Pt has sister with 2-3 children with autism. 58 yo Hispanic FOB in good health. FOB has a twin MGF.  OB History    Gravida Para Term Preterm AB Living   SAB TAB Ectopic Multiple Live Births   1     0 1      Obstetric Comments    D&C after SAB    OB/GYN History:  [id number] 1 molar pregnancy 1st pregnancy 2007 Molar pregnancy D&C  2nd pregnancy: 2012 NSVD of viable girl at 40 weeks 3rd pregnancy: 2013 NSVD of viable female at 25 weeks 4th pregnancy: 2016 NSVD of viable female at term     Maternal Diabetes: No Genetic Screening: Normal Maternal Ultrasounds/Referrals: Normal Fetal Ultrasounds or other Referrals:  None Maternal Substance Abuse:  On Methadone Significant Maternal Medications:  Meds include: Other:  Significant Maternal Lab Results:  None Other Comments:  None ROS: General: No weight loss or weight gain, no  fatigue, no Fevers. No  dizziness HEENT: No change in vision, double vision or loss of hearing. No nosebleeds, difficulty swallowing, sore throat or any other complaints.  HEART: No CP,palpatations, swelling in the feet or legs or difficulty breathing while lying flat. LUNGS: No SOB, Cough, Congestion or wheezing. GASTROINTESTINAL: Nausea, vomiting, diarrhea, constipation, heartburn or stomach pain. GENITOURINARY: No urgency, frequency or dysuria. MUSCULOSKELETAL: No joint pain, weakness, cramping or back pain. NEUROLOGIC: No frequent HA's, numbness, blackouts or dizziness. PSYCHIATRIC: No anxiety, panic or depression or increased stress or suicidal ideation. ENDOCRINE: No heat or cold intolerance, excessive thirst or hunger. HEMATOLOGIC/LYMPH: No easy bruising or swollen lymph nodes. GYN: No vaginal bleeding, cramping or vaginal odor. Review of Systems  Constitutional: Negative.   HENT: Negative.   Eyes: Negative.   Respiratory: Negative.   Cardiovascular: Negative.  Gastrointestinal: Negative.   Genitourinary: Negative.   Musculoskeletal: Negative.   Skin: Negative.   Neurological: Negative.   Endo/Heme/Allergies: Negative.   Psychiatric/Behavioral: Negative.    EXAM: Gen:26 yo white female in NAD. HEENT:normocephalic, Eyes non-icteric. Heart: S1S2, RRR, No M/R/G Lungs: CTA   bilat, no W/R/R. Abd: Gravid, EFW 6#20z History Dilation: 3 Effacement (%): 80 Station: -2 Exam by:: C. Yetta Barre, CNM Blood pressure 114/69, pulse 91, temperature 98.6 F (37 C), temperature source Oral, resp. rate 18, height  (1.575 m), weight 70.8 kg (156 lb), last menstrual period 09/22/2016. Exam Physical Exam  Prenatal labs: ABO, Rh: --/--/O NEG (02/21 1500) Antibody: NEG (02/08 1438) Rubella:   Immune RPR:   Non Reactive  HBsAg:   Neg HIV:   Neg GBS:   neg GC/CH neg Varicella: Immune 1 h GCt:133 Assessment/Plan: A:1. IUP at 39 weeks 2. Methadone  3. Substance abuse 4. PTSD 5. AROM of bloody clear fluid at 2120:FHR 130 after AROM   [name] 06/22/2017, 9:37 PM

## 2017-06-22 NOTE — Discharge Summary (Signed)
Obstetric Discharge Summary Reason for Admission: IOL at 39 weeks due to Methadone for Addiction Prenatal Procedures:  [place], NST, Methadone clinic daily in [place] where pt attends Intrapartum Procedures:AROM, Pitocin, IUPC Postpartum Procedures: None Complications-Operative and Postpartum: None  Hemoglobin  Date Value Ref Range Status  06/22/2017 11.6 (L) 12.0 - 16.0 g/dL Final   HGB  Date Value Ref Range Status  09/24/2014 13.3 12.0 - 16.0 g/dL Final   HCT  Date Value Ref Range Status  06/22/2017 33.9 (L) 35.0 - 47.0 % Final  09/24/2014 39.6 35.0 - 47.0 % Final    Physical Exam:  General: A,A&O x3 Heart: S1S2, RRR, No M/R/G/ Lungs: CTA bilat, no W/R/R Lochia: mod, no clots Uterine Fundus: FF, U_1 Incision:N/A DVT Evaluation:Neg Homans  Discharge Diagnoses:1. NSVD of viable female infant with intact perineum, Methadone for Addiction  Discharge Information: Date: 06/22/2017 Activity: pelvic rest Diet: routine Medications: PNV and Ibuprofen Condition: stable Instructions: refer to practice specific booklet and No driving x 2 weeks, no sex x 6 weeks Discharge to: home   Newborn Data: This patient has no babies on file. Home with Baby has to stay for 5 days for NAS assessment.  Dominique Frost 06/22/2017, 10:08 PM

## 2017-06-22 NOTE — Progress Notes (Addendum)
Dominique Frost is a 26 y.o. [id number] at [redacted]w[redacted]d by LMP c/w [place] admitted for IOL due to 39 weeks and Methadone due addiction to klonopin, valium, xanax.   Subjective: I am comfortable   Objective: BP 114/69 (BP Location: Left Arm)   Pulse 91   Temp 98.6 F (37 C) (Oral)   Resp 18   Ht  (1.575 m)   Wt 70.8 kg (156 lb)   LMP 09/22/2016   BMI 28.53 kg/m  No intake/output data recorded. No intake/output data recorded.  FHT: 135, +accels, no decels, Cat 1 UC:    q 7-8 mins, mild SVE:   Dilation: 3 Effacement (%): 80 Station: -2 Exam by:: Beatriz Stallion, CNM  Labs: Lab Results  Component Value Date   WBC 16.6 (H) 06/22/2017   HGB 11.6 (L) 06/22/2017   HCT 33.9 (L) 06/22/2017   MCV 88.9 06/22/2017   PLT 176 06/22/2017    Assessment / Plan: A;1. IUP at 39 weeks 2. IOL for Methadone addiction P: Start Pitocin per protocol  Labor: Induction started  Preeclampsia: None Fetal Wellbeing:  Reassuring  Pain Control: None at present  Anticipated MOD:  NSVD  [name] 06/22/2017, 10:04 PM

## 2017-06-22 NOTE — Anesthesia Preprocedure Evaluation (Signed)
Anesthesia Evaluation  Patient identified by MRN, date of birth, ID band Patient awake    Reviewed: Allergy & Precautions, NPO status , Patient's Chart, lab work & pertinent test results  Airway Mallampati: II  TM Distance: >3 FB     Dental no notable dental hx.    Pulmonary Current Smoker,    Pulmonary exam normal        Cardiovascular negative cardio ROS Normal cardiovascular exam     Neuro/Psych PSYCHIATRIC DISORDERS Anxiety Depression negative neurological ROS     GI/Hepatic negative GI ROS, (+)     substance abuse  marijuana use and IV drug use,   Endo/Other  negative endocrine ROS  Renal/GU negative Renal ROS  negative genitourinary   Musculoskeletal negative musculoskeletal ROS (+)   Abdominal Normal abdominal exam  (+)   Peds negative pediatric ROS (+)  Hematology negative hematology ROS (+)   Anesthesia Other Findings   Reproductive/Obstetrics (+) Pregnancy                             Anesthesia Physical Anesthesia Plan  ASA: II  Anesthesia Plan: Epidural   Post-op Pain Management:    Induction:   PONV Risk Score and Plan:   Airway Management Planned: Natural Airway  Additional Equipment:   Intra-op Plan:   Post-operative Plan:   Informed Consent: I have reviewed the patients History and Physical, chart, labs and discussed the procedure including the risks, benefits and alternatives for the proposed anesthesia with the patient or authorized representative who has indicated his/her understanding and acceptance.   Dental advisory given  Plan Discussed with: CRNA and Surgeon  Anesthesia Plan Comments:         Anesthesia Quick Evaluation

## 2017-06-23 LAB — URINE DRUG SCREEN, QUALITATIVE (ARMC ONLY)
Amphetamines, Ur Screen: NOT DETECTED
BARBITURATES, UR SCREEN: NOT DETECTED
BENZODIAZEPINE, UR SCRN: POSITIVE — AB
CANNABINOID 50 NG, UR ~~LOC~~: NOT DETECTED
Cocaine Metabolite,Ur ~~LOC~~: NOT DETECTED
MDMA (Ecstasy)Ur Screen: NOT DETECTED
Methadone Scn, Ur: POSITIVE — AB
OPIATE, UR SCREEN: NOT DETECTED
PHENCYCLIDINE (PCP) UR S: NOT DETECTED
Tricyclic, Ur Screen: NOT DETECTED

## 2017-06-23 LAB — CBC
HEMATOCRIT: 33 % — AB (ref 35.0–47.0)
HEMOGLOBIN: 11.6 g/dL — AB (ref 12.0–16.0)
MCH: 31.7 pg (ref 26.0–34.0)
MCHC: 35.1 g/dL (ref 32.0–36.0)
MCV: 90.3 fL (ref 80.0–100.0)
Platelets: 148 10*3/uL — ABNORMAL LOW (ref 150–440)
RBC: 3.66 MIL/uL — ABNORMAL LOW (ref 3.80–5.20)
RDW: 13.5 % (ref 11.5–14.5)
WBC: 17.6 10*3/uL — ABNORMAL HIGH (ref 3.6–11.0)

## 2017-06-23 LAB — FETAL SCREEN: FETAL SCREEN: NEGATIVE

## 2017-06-23 MED ORDER — PHENYLEPHRINE 40 MCG/ML (10ML) SYRINGE FOR IV PUSH (FOR BLOOD PRESSURE SUPPORT)
80.0000 ug | PREFILLED_SYRINGE | INTRAVENOUS | Status: DC | PRN
  Filled 2017-06-23: qty 5

## 2017-06-23 MED ORDER — METHADONE HCL 10 MG PO TABS
60.0000 mg | ORAL_TABLET | Freq: Every day | ORAL | Status: DC
  Administered 2017-06-23 – 2017-06-25 (×3): 60 mg via ORAL
  Filled 2017-06-23 (×4): qty 6

## 2017-06-23 MED ORDER — RHO D IMMUNE GLOBULIN 1500 UNIT/2ML IJ SOSY
300.0000 ug | PREFILLED_SYRINGE | Freq: Once | INTRAMUSCULAR | Status: AC
  Administered 2017-06-23: 300 ug via INTRAMUSCULAR
  Filled 2017-06-23: qty 2

## 2017-06-23 MED ORDER — IBUPROFEN 600 MG PO TABS
ORAL_TABLET | ORAL | Status: AC
  Filled 2017-06-23: qty 1

## 2017-06-23 MED ORDER — ONDANSETRON HCL 4 MG PO TABS: 4.0000 mg | ORAL_TABLET | ORAL | Status: DC | PRN

## 2017-06-23 MED ORDER — COCONUT OIL OIL: 1.0000 "application " | TOPICAL_OIL | Status: DC | PRN

## 2017-06-23 MED ORDER — MEASLES, MUMPS & RUBELLA VAC ~~LOC~~ INJ: 0.5000 mL | INJECTION | Freq: Once | SUBCUTANEOUS | Status: DC

## 2017-06-23 MED ORDER — SIMETHICONE 80 MG PO CHEW: 80.0000 mg | CHEWABLE_TABLET | ORAL | Status: DC | PRN

## 2017-06-23 MED ORDER — BISACODYL 10 MG RE SUPP: 10.0000 mg | Freq: Every day | RECTAL | Status: DC | PRN

## 2017-06-23 MED ORDER — SODIUM CHLORIDE 0.9 % IV SOLN: 250.0000 mL | INTRAVENOUS | Status: DC | PRN

## 2017-06-23 MED ORDER — ACETAMINOPHEN 325 MG PO TABS
650.0000 mg | ORAL_TABLET | ORAL | Status: DC | PRN
  Administered 2017-06-23 – 2017-06-24 (×7): 650 mg via ORAL
  Filled 2017-06-23 (×7): qty 2

## 2017-06-23 MED ORDER — BUTORPHANOL TARTRATE 2 MG/ML IJ SOLN: 1.0000 mg | INTRAMUSCULAR | Status: DC | PRN

## 2017-06-23 MED ORDER — IBUPROFEN 600 MG PO TABS
600.0000 mg | ORAL_TABLET | Freq: Four times a day (QID) | ORAL | Status: DC
  Administered 2017-06-23 – 2017-06-25 (×10): 600 mg via ORAL
  Filled 2017-06-23 (×8): qty 1

## 2017-06-23 MED ORDER — SENNOSIDES-DOCUSATE SODIUM 8.6-50 MG PO TABS
2.0000 | ORAL_TABLET | ORAL | Status: DC
  Administered 2017-06-24 (×2): 2 via ORAL
  Filled 2017-06-23 (×2): qty 2

## 2017-06-23 MED ORDER — ONDANSETRON HCL 4 MG/2ML IJ SOLN: 4.0000 mg | INTRAMUSCULAR | Status: DC | PRN

## 2017-06-23 MED ORDER — SERTRALINE HCL 100 MG PO TABS: ORAL_TABLET | ORAL | 6 refills | Status: DC

## 2017-06-23 MED ORDER — DIPHENHYDRAMINE HCL 50 MG/ML IJ SOLN: 12.5000 mg | INTRAMUSCULAR | Status: DC | PRN

## 2017-06-23 MED ORDER — EPHEDRINE 5 MG/ML INJ
10.0000 mg | INTRAVENOUS | Status: DC | PRN
  Filled 2017-06-23: qty 2

## 2017-06-23 MED ORDER — SODIUM CHLORIDE 0.9% FLUSH: 3.0000 mL | Freq: Two times a day (BID) | INTRAVENOUS | Status: DC

## 2017-06-23 MED ORDER — SODIUM CHLORIDE 0.9% FLUSH: 3.0000 mL | INTRAVENOUS | Status: DC | PRN

## 2017-06-23 MED ORDER — BENZOCAINE-MENTHOL 20-0.5 % EX AERO
1.0000 "application " | INHALATION_SPRAY | CUTANEOUS | Status: DC | PRN
  Administered 2017-06-23: 1 via TOPICAL
  Filled 2017-06-23 (×2): qty 56

## 2017-06-23 MED ORDER — LACTATED RINGERS IV SOLN: 500.0000 mL | Freq: Once | INTRAVENOUS | Status: DC

## 2017-06-23 MED ORDER — DIPHENHYDRAMINE HCL 25 MG PO CAPS: 25.0000 mg | ORAL_CAPSULE | Freq: Four times a day (QID) | ORAL | Status: DC | PRN

## 2017-06-23 MED ORDER — LIDOCAINE HCL (PF) 2 % IJ SOLN
INTRAMUSCULAR | Status: DC | PRN
  Administered 2017-06-23: 5 mL via INTRADERMAL

## 2017-06-23 MED ORDER — FENTANYL 2.5 MCG/ML W/ROPIVACAINE 0.15% IN NS 100 ML EPIDURAL (ARMC)
12.0000 mL/h | EPIDURAL | Status: DC
  Administered 2017-06-23: 12 mL/h via EPIDURAL

## 2017-06-23 MED ORDER — PRENATAL MULTIVITAMIN CH
1.0000 | ORAL_TABLET | Freq: Every day | ORAL | Status: DC
  Administered 2017-06-23 – 2017-06-25 (×3): 1 via ORAL
  Filled 2017-06-23 (×2): qty 1

## 2017-06-23 MED ORDER — SERTRALINE HCL 25 MG PO TABS
150.0000 mg | ORAL_TABLET | Freq: Every day | ORAL | Status: DC
  Administered 2017-06-23 – 2017-06-25 (×3): 150 mg via ORAL
  Filled 2017-06-23 (×2): qty 2
  Filled 2017-06-23: qty 1
  Filled 2017-06-23: qty 2

## 2017-06-23 MED ORDER — SODIUM CHLORIDE FLUSH 0.9 % IV SOLN
INTRAVENOUS | Status: AC
  Filled 2017-06-23: qty 10

## 2017-06-23 MED ORDER — WITCH HAZEL-GLYCERIN EX PADS: 1.0000 "application " | MEDICATED_PAD | CUTANEOUS | Status: DC | PRN

## 2017-06-23 MED ORDER — FLEET ENEMA 7-19 GM/118ML RE ENEM
1.0000 | ENEMA | Freq: Every day | RECTAL | Status: DC | PRN
Start: 2017-06-23 — End: 2017-06-25

## 2017-06-23 MED ORDER — TETANUS-DIPHTH-ACELL PERTUSSIS 5-2.5-18.5 LF-MCG/0.5 IM SUSP: 0.5000 mL | Freq: Once | INTRAMUSCULAR | Status: DC

## 2017-06-23 MED ORDER — DIBUCAINE 1 % RE OINT: 1.0000 "application " | TOPICAL_OINTMENT | RECTAL | Status: DC | PRN

## 2017-06-23 NOTE — Clinical Social Work Note (Signed)
CSW attempted to assess patient; however, due to the medical needs/feeding issues, the CSW recommended continuing the assessment at a later time so that the MOB could focus appropriately on feeding instructions without interruption. CSW will revisit in the morning.  Argentina Ponder, MSW, Theresia Majors [phone]

## 2017-06-23 NOTE — Progress Notes (Signed)
Post Partum Day Del day Subjective: "I want my Clonazepam and to restart on Zoloft 150 mg qd"  Objective: Blood pressure (!) 109/51, pulse (!) 52, temperature 97.8 F (36.6 C), temperature source Oral, resp. rate 17, height  (1.575 m), weight 70.8 kg (156 lb), last menstrual period 09/22/2016, SpO2 100 %.  Physical Exam:  General: alert, cooperative and appears stated age Lochia: appropriate Uterine Fundus: firm Incision: healing well, no significant drainage DVT Evaluation: No evidence of DVT seen on physical exam.   Recent Labs  06/22/17 2138 06/23/17 0743  HGB 11.6* 11.6*  HCT 33.9* 33.0*    Assessment/Plan: A:1. PP Delivery Day 2. Methadone for Addiction 3. Depression P: Will start on Zoloft 150 mg po daily 2. Pt sees Roz who is able to prescribe Clonazepam and also will help to titrate the Methadone. Pt wants to get off the Methadone.  [name], RN, MSN, CNM, FNP   LOS: 1 day   [name] 06/23/2017, 11:24 AM

## 2017-06-23 NOTE — Anesthesia Procedure Notes (Signed)
Epidural Patient location during procedure: OB Start time: 06/22/2017 11:33 PM End time: 06/22/2017 11:40 PM  Staffing Anesthesiologist: Yves Dill Performed: anesthesiologist   Preanesthetic Checklist Completed: patient identified, site marked, surgical consent, pre-op evaluation, timeout performed, IV checked, risks and benefits discussed and monitors and equipment checked  Epidural Patient position: sitting Prep: Betadine Patient monitoring: heart rate, continuous pulse ox and blood pressure Approach: midline Location: L3-L4  Needle:  Needle type: Tuohy  Needle gauge: 17 G Needle length: 9 cm and 9 Catheter type: closed end flexible Catheter size: 19 Gauge Test dose: negative and 1.5% lidocaine with Epi 1:200 K  Assessment Sensory level: T8 Events: blood not aspirated, injection not painful, no injection resistance, negative IV test and no paresthesia  Additional Notes Time out called.  Patient placed in sitting position.  Back prepped and draped in sterile fashion.  A skin wheal was made in the L3-L4 interspace with 1% Lidocaine plain.  A 17 G Tuohy needle was advanced into the epidural space by a loss of resistance technique.  A small amount of blood came from the needle , but cleared with 10 cc of NS.  The epidural catheter was threaded 3 cm into the epidural space with a negative TD.  The patient tolerated the procedure well.  The catheter was affixed to the back in sterile fashion.Reason for block:procedure for pain

## 2017-06-23 NOTE — Progress Notes (Signed)
Patient ID: Dominique Frost, female   DOB: 1991/07/30, 26 y.o.   MRN: [id number] Called Dr Dalbert Garnet re: BTL that pt wanted. Pt had signed papers and are listed in the summary with the date of 03/29/17 but, paper or scanned copy is not available. Will see pt in 1-2 weeks per Dr Dalbert Garnet and schedule to be done at 5 weeks pp.

## 2017-06-23 NOTE — Anesthesia Postprocedure Evaluation (Signed)
Anesthesia Post Note  Patient: Dominique Frost  Procedure(s) Performed: * No procedures listed *  Patient location during evaluation: Mother Baby Anesthesia Type: Epidural Level of consciousness: awake and alert Pain management: pain level controlled Vital Signs Assessment: post-procedure vital signs reviewed and stable Respiratory status: spontaneous breathing, nonlabored ventilation and respiratory function stable Cardiovascular status: stable Postop Assessment: no headache, no backache and epidural receding Anesthetic complications: no     Last Vitals:  Vitals:   06/23/17 0405 06/23/17 0724  BP: 107/63 (!) 109/51  Pulse: (!) 55 (!) 52  Resp:  17  Temp: 37 C 36.6 C  SpO2:  100%    Last Pain:  Vitals:   06/23/17 0835  TempSrc:   PainSc: 7                  [name]

## 2017-06-24 DIAGNOSIS — F4323 Adjustment disorder with mixed anxiety and depressed mood: Secondary | ICD-10-CM

## 2017-06-24 DIAGNOSIS — F1110 Opioid abuse, uncomplicated: Secondary | ICD-10-CM

## 2017-06-24 LAB — RHOGAM INJECTION: Unit division: 0

## 2017-06-24 LAB — TYPE AND SCREEN
ABO/RH(D): O NEG
Antibody Screen: POSITIVE
UNIT DIVISION: 0
Unit division: 0

## 2017-06-24 LAB — BPAM RBC
BLOOD PRODUCT EXPIRATION DATE: 201810272359
Blood Product Expiration Date: 201810292359
UNIT TYPE AND RH: 9500
UNIT TYPE AND RH: 9500

## 2017-06-24 LAB — RPR: RPR: NONREACTIVE

## 2017-06-24 LAB — HIV ANTIBODY (ROUTINE TESTING W REFLEX): HIV Screen 4th Generation wRfx: NONREACTIVE

## 2017-06-24 MED ORDER — PAROXETINE HCL 10 MG PO TABS
10.0000 mg | ORAL_TABLET | Freq: Every day | ORAL | Status: DC
  Administered 2017-06-24 – 2017-06-25 (×2): 10 mg via ORAL
  Filled 2017-06-24 (×3): qty 1

## 2017-06-24 MED ORDER — PAROXETINE HCL 10 MG PO TABS: 10.0000 mg | ORAL_TABLET | Freq: Every day | ORAL | 0 refills | Status: DC

## 2017-06-24 MED ORDER — HYDROXYZINE HCL 25 MG PO TABS
50.0000 mg | ORAL_TABLET | Freq: Two times a day (BID) | ORAL | Status: DC | PRN
  Administered 2017-06-24: 50 mg via ORAL
  Filled 2017-06-24: qty 2

## 2017-06-24 MED ORDER — HYDROXYZINE PAMOATE 50 MG PO CAPS: 50.0000 mg | ORAL_CAPSULE | Freq: Two times a day (BID) | ORAL | 0 refills | Status: DC | PRN

## 2017-06-24 NOTE — Progress Notes (Addendum)
Post Partum Day 1 Subjective: "I want to wean off the Methadone", Seeing my baby like this is stressing me out and I am ready to give it up. Life no Methadone is not good.   Objective: Blood pressure (!) 106/52, pulse (!) 56, temperature 97.8 F (36.6 C), temperature source Oral, resp. rate 18, height  (1.575 m), weight 70.8 kg (156 lb), last menstrual period 09/22/2016, SpO2 92 %.  Physical Exam:  General: A,A&O x3 Heart:S1S2,RRR, No M/R/G Lungs:CTA bilat, no W/R/R.  Lochia:mod, no clots Uterine Fundus:FF and lochia mod Incision: None DVT Evaluation: Neg Homans   Recent Labs  06/22/17 2138 06/23/17 0743  HGB 11.6* 11.6*  HCT 33.9* 33.0*    Assessment/Plan: A:NSVD of viable female infant who may be starting withdrawal 2. Methadone due to substance abuse 3. Tobacco Abuse 4. At risk for PP depression P:1. Psych consult for methadone discussion UV:[id number] vs. Fu with pt's clinic 2. Anemia:Will tx with Fe 3. Disc stop smoking and pt has a vaping device 4. Support pt as baby will probably have to stay in NICU for NAS 5. Stop smoking    LOS: 2 days   [name] 06/24/2017, 12:52 PM

## 2017-06-24 NOTE — Progress Notes (Signed)
CH responded to a PG concerning the Pt in 350. The Pt is feeling guilt due to her drug addiction and the complications that holds for her newborn son. Pt states that she needs to stop using. CH asked if she wanted to stop using. Pt said that she both needed to and wanted to stop using. CH asked if there were persons that would help her to be accountable. Pt stated that her Mother and Father, her husband, and her children will keep her accountable. Pt requested prayer of forgiveness and strength, which was provided. CH is available for follow up as needed.     06/24/17 1800  Clinical Encounter Type  Visited With Patient;Health care provider  Visit Type Initial;Spiritual support  Referral From Nurse  Spiritual Encounters  Spiritual Needs Prayer;Emotional

## 2017-06-24 NOTE — Consult Note (Signed)
[facility] Consult   Reason for Consult:  Consult for 26 year old woman with a history of mental health problems and substance abuse who just delivered a baby. Concern about anxiety and depression [place] Referring Physician:  Ronnald Ramp Patient Identification: Dominique Frost MRN:  [id number] Principal Diagnosis: Adjustment disorder with mixed anxiety and depressed mood Diagnosis:   Patient Active Problem List   Diagnosis Date Noted  . Adjustment disorder with mixed anxiety and depressed mood [F43.23] 06/24/2017  . Opiate abuse, continuous [F11.10] 06/24/2017  . Encounter for planned induction of labor [Z34.90] 06/22/2017  . Pregnancy [Z34.90] 04/25/2017  . Labor and delivery indication for care or intervention [O75.9] 03/24/2017  . Narcotic dependency, continuous ([place]) [F11.20] 03/08/2017  . SVD (spontaneous vaginal delivery) [O80] 04/21/2015  . Drug abuse, opioid type [F11.10] 08/05/2014  . Alcohol addiction ([place]) [F10.20] 03/11/2014  . 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 Borderline personality disorder [F60.3] 10/30/2013  . Cannabis abuse, continuous [F12.10] 10/30/2013  . Neurosis, posttraumatic [F43.10] 10/30/2013  . Compulsive tobacco user syndrome [F17.200] 09/20/2012  . 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 female patient admitted with "my nerves are just really bad".  HPI:  Patient seen and chart reviewed. Spoke with the patient and her mother who was present during the interview. Case reviewed with nursing and nurse midwife. This is a 26 year old woman who is in the hospital for regular vaginal delivery of a healthy infant which occurred yesterday. Patient says she slept very poorly last night. She is feeling very nervous and anxious. Major acute  stressor is being told that the newborn baby is showing signs of opiate withdrawal and may have to stay in the hospital rather than going immediately home. Patient says this is making her feel guilty and negative. She is feeling more irritable. Even before delivering the baby her irritability had been somewhat increased. She has been taking Zoloft 150 mg consistently throughout the pregnancy. The patient denies that she is abusing any drugs or alcohol anytime recently. She is a chronic methadone patient is well. She is today expressing the desire to stop taking methadone. Denies any psychotic symptoms. Denies any current suicidal thoughts.  Social history: Patient is married and has a 37-year-old child at home. Will be returning home with that family. Has her mother and other extended family around who are helpful to her. Not currently working outside the home. Says that it is a safe and supportive environment.  Medical history: Other than the pregnancy now concluded and her mental health and substance abuse issues no other diagnosed ongoing medical problems  Substance abuse history: Patient has a documented clear long history of abuse of multiple substances. She is documented to have had problems with abuse of benzodiazepines including even a hospitalization at [facility] for a primary diagnosis of benzodiazepine abuse. Patient claims that she "had been" on Klonopin. She is rather vague about how long ago she was last taking it but implies that her current psychiatrist has not been prescribing it for her. I am unable to consult the controlled substance database today because of the recent computer changeover. Patient did have a drug screen positive for benzodiazepines on presentation on the 21st. I reviewed all the orders and it does not look like she was prescribed any benzodiazepines at any point during that  initial presentation. This would suggest that she had been taking benzodiazepines recently. Nevertheless she  denies it and denies any other drug abuse currently.  Past Psychiatric History: Patient has had several prior hospitalizations and has had suicide attempts although she says it's been years since then. Diagnosis of depression and PTSD and borderline personality disorder. Current medications are Zoloft 150 mg per day which she says has been partially helpful. Has been on multiple medicines in the past. She is now expressing to me a desire to change her medicines based on a feeling that her current medicine is just not working "good enough"  Risk to Self: Is patient at risk for suicide?: No Risk to Others:   Prior Inpatient Therapy:   Prior Outpatient Therapy:    Past Medical History:  Past Medical History:  Diagnosis Date  . Borderline personality disorder   . Depression   . Generalized anxiety disorder   . PTSD (post-traumatic stress disorder)     Past Surgical History:  Procedure Laterality Date  . DILATION AND CURETTAGE OF UTERUS     Family History:  Family History  Problem Relation Age of Onset  . Cancer Neg Hx   . Diabetes Neg Hx   . Heart disease Neg Hx    Family Psychiatric  History: Positive for depression and anxiety in her mother Social History:  History  Alcohol Use No     History  Drug Use    Comment: currently takes methodone     Social History   Social History  . Marital status: Married    Spouse name: N/A  . Number of children: N/A  . Years of education: N/A   Social History Main Topics  . Smoking status: Heavy Tobacco Smoker    Packs/day: 0.50    Years: 10.00    Types: Cigarettes  . Smokeless tobacco: Never Used  . Alcohol use No  . Drug use: Yes     Comment: currently takes methodone   . Sexual activity: Yes   Other Topics Concern  . None   Social History Narrative  . None   Additional Social History:    Allergies:   Allergies  Allergen Reactions  . Clonidine     Other reaction(s): RASH  . Vancomycin     Other reaction(s): RASH     Labs:  Results for orders placed or performed during the hospital encounter of 06/22/17 (from the past 48 hour(s))  Type and screen [place]     Status: None   Collection Time: 06/22/17  9:38 PM  Result Value Ref Range   ABO/RH(D) O NEG    Antibody Screen POS    Sample Expiration 06/25/2017    Antibody Identification PASSIVELY ACQUIRED ANTI-D    Unit Number [id number]    Blood Component Type RED CELLS,LR    Unit division 00    Status of Unit REL FROM [facility]    Transfusion Status OK TO TRANSFUSE    Crossmatch Result COMPATIBLE    Unit Number [id number]    Blood Component Type RED CELLS,LR    Unit division 00    Status of Unit REL FROM [facility]    Transfusion Status OK TO TRANSFUSE    Crossmatch Result COMPATIBLE   RPR     Status: None   Collection Time: 06/22/17  9:38 PM  Result Value Ref Range   RPR Ser Ql Non Reactive Non Reactive    Comment: (NOTE) Performed At: [facility] [address]  [place], [place] [id number] Lindon Romp MD [id number]   HIV antibody (Routine Testing)     Status: None   Collection Time: 06/22/17  9:38 PM  Result Value Ref Range   HIV Screen 4th Generation wRfx Non Reactive Non Reactive    Comment: (NOTE) Performed At: [facility] [place], [place] [id number] Lindon Romp MD [id number]   CBC     Status: Abnormal   Collection Time: 06/22/17  9:38 PM  Result Value Ref Range   WBC 16.6 (H) 3.6 - 11.0 K/uL   RBC 3.82 3.80 - 5.20 MIL/uL   Hemoglobin 11.6 (L) 12.0 - 16.0 g/dL   HCT 33.9 (L) 35.0 - 47.0 %   MCV 88.9 80.0 - 100.0 fL   MCH 30.5 26.0 - 34.0 pg   MCHC 34.3 32.0 - 36.0 g/dL   RDW 13.4 11.5 - 14.5 %   Platelets 176 150 - 440 K/uL  Urine Drug Screen, Qualitative ([facility] only)     Status: Abnormal   Collection Time: 06/22/17 10:57 PM  Result Value Ref Range   Tricyclic, Ur Screen NONE DETECTED NONE DETECTED   Amphetamines, Ur Screen NONE DETECTED NONE DETECTED    MDMA (Ecstasy)Ur Screen NONE DETECTED NONE DETECTED   Cocaine Metabolite,Ur [place] NONE DETECTED NONE DETECTED   Opiate, Ur Screen NONE DETECTED NONE DETECTED   Phencyclidine (PCP) Ur S NONE DETECTED NONE DETECTED   Cannabinoid 50 Ng, Ur [place] NONE DETECTED NONE DETECTED   Barbiturates, Ur Screen NONE DETECTED NONE DETECTED   Benzodiazepine, Ur Scrn POSITIVE (A) NONE DETECTED   Methadone Scn, Ur POSITIVE (A) NONE DETECTED    Comment: (NOTE) 282  Tricyclics, urine               Cutoff 1000 ng/mL 200  Amphetamines, urine             Cutoff 1000 ng/mL 300  MDMA (Ecstasy), urine           Cutoff 500 ng/mL 400  Cocaine Metabolite, urine       Cutoff 300 ng/mL 500  Opiate, urine                   Cutoff 300 ng/mL 600  Phencyclidine (PCP), urine      Cutoff 25 ng/mL 700  Cannabinoid, urine              Cutoff 50 ng/mL 800  Barbiturates, urine             Cutoff 200 ng/mL 900  Benzodiazepine, urine           Cutoff 200 ng/mL 1000 Methadone, urine                Cutoff 300 ng/mL 1100 1200 The urine drug screen provides only a preliminary, unconfirmed 1300 analytical test result and should not be used for non-medical 1400 purposes. Clinical consideration and professional judgment should 1500 be applied to any positive drug screen result due to possible 1600 interfering substances. A more specific alternate chemical method 1700 must be used in order to obtain a confirmed analytical result.  1800 Gas chromato graphy / mass spectrometry (GC/MS) is the preferred 1900 confirmatory method.   Fetal screen     Status: None   Collection Time: 06/23/17  7:43 AM  Result Value Ref Range   Fetal Screen NEG   Rhogam injection     Status: None   Collection Time: 06/23/17  7:43 AM  Result Value  Ref Range   Unit Number [id number]    Blood Component Type RHIG    Unit division 00    Status of Unit ISSUED,FINAL    Transfusion Status OK TO TRANSFUSE   CBC     Status: Abnormal   Collection Time: 06/23/17   7:43 AM  Result Value Ref Range   WBC 17.6 (H) 3.6 - 11.0 K/uL   RBC 3.66 (L) 3.80 - 5.20 MIL/uL   Hemoglobin 11.6 (L) 12.0 - 16.0 g/dL   HCT 33.0 (L) 35.0 - 47.0 %   MCV 90.3 80.0 - 100.0 fL   MCH 31.7 26.0 - 34.0 pg   MCHC 35.1 32.0 - 36.0 g/dL   RDW 13.5 11.5 - 14.5 %   Platelets 148 (L) 150 - 440 K/uL    Current Facility-Administered Medications  Medication Dose Route Frequency Provider Last Rate Last Dose  . 0.9 %  sodium chloride infusion  250 mL Intravenous PRN Catheryn Bacon, CNM      . acetaminophen (TYLENOL) tablet 650 mg  650 mg Oral Q4H PRN Catheryn Bacon, CNM   650 mg at 06/24/17 1029  . benzocaine-Menthol (DERMOPLAST) 20-0.5 % topical spray 1 application  1 application Topical PRN Catheryn Bacon, CNM   1 application at 85/63/14 [phone]  . bisacodyl (DULCOLAX) suppository 10 mg  10 mg Rectal Daily PRN Catheryn Bacon, CNM      . coconut oil  1 application Topical PRN Catheryn Bacon, CNM      . witch hazel-glycerin (TUCKS) pad 1 application  1 application Topical PRN Catheryn Bacon, CNM       And  . dibucaine (NUPERCAINAL) 1 % rectal ointment 1 application  1 application Rectal PRN Catheryn Bacon, CNM      . diphenhydrAMINE (BENADRYL) capsule 25 mg  25 mg Oral Q6H PRN Catheryn Bacon, CNM      . diphenhydrAMINE (BENADRYL) injection 12.5 mg  12.5 mg Intravenous Q15 min PRN Alvin Critchley, MD      . ibuprofen (ADVIL,MOTRIN) tablet 600 mg  600 mg Oral Q6H Catheryn Bacon, CNM   600 mg at 06/24/17 1130  . measles, mumps and rubella vaccine (MMR) injection 0.5 mL  0.5 mL Subcutaneous Once Catheryn Bacon, CNM      . methadone (DOLOPHINE) tablet 60 mg  60 mg Oral Daily Catheryn Bacon, CNM   60 mg at 06/24/17 1020  . ondansetron (ZOFRAN) tablet 4 mg  4 mg Oral Q4H PRN Catheryn Bacon, CNM       Or  . ondansetron [facility]) injection 4 mg  4 mg Intravenous Q4H PRN Catheryn Bacon, CNM      . oxytocin (PITOCIN) IV infusion 40 units in LR 1000 mL - Premix  1-40 milli-units/min Intravenous  Continuous Catheryn Bacon, CNM   Stopped at 06/23/17 0200  . prenatal multivitamin tablet 1 tablet  1 tablet Oral Q1200 Catheryn Bacon, CNM   1 tablet at 06/24/17 1113  . senna-docusate (Senokot-S) tablet 2 tablet  2 tablet Oral Q24H Catheryn Bacon, CNM   2 tablet at 06/24/17 0021  . sertraline (ZOLOFT) tablet 150 mg  150 mg Oral Daily Catheryn Bacon, CNM   150 mg at 06/24/17 6378  . simethicone (MYLICON) chewable tablet 80 mg  80 mg Oral PRN Catheryn Bacon, CNM      . sodium phosphate (FLEET) 7-19 GM/118ML enema 1 enema  1 enema Rectal Daily PRN  Catheryn Bacon, CNM      . Tdap (BOOSTRIX) injection 0.5 mL  0.5 mL Intramuscular Once Catheryn Bacon, CNM        Musculoskeletal: Strength & Muscle Tone: within normal limits Gait & Station: normal Patient leans: N/A  Psychiatric Specialty Exam: Physical Exam  Nursing note and vitals reviewed. Constitutional: She appears well-developed and well-nourished.  HENT:  Head: Normocephalic and atraumatic.  Eyes: Pupils are equal, round, and reactive to light. Conjunctivae are normal.  Neck: Normal range of motion.  Cardiovascular: Regular rhythm and normal heart sounds.   Respiratory: Effort normal. No respiratory distress.  GI: Soft.  Musculoskeletal: Normal range of motion.  Neurological: She is alert.  Skin: Skin is warm and dry.  Psychiatric: Her speech is normal and behavior is normal. Thought content normal. Her mood appears anxious. Cognition and memory are normal. She expresses impulsivity.    Review of Systems  Constitutional: Negative.   HENT: Negative.   Eyes: Negative.   Respiratory: Negative.   Cardiovascular: Negative.   Gastrointestinal: Negative.   Musculoskeletal: Negative.   Skin: Negative.   Neurological: Negative.   Psychiatric/Behavioral: Positive for depression. Negative for hallucinations, memory loss, substance abuse and suicidal ideas. The patient is nervous/anxious and has insomnia.     Blood pressure (!) 106/52,  pulse (!) 56, temperature 98.9 F (37.2 C), temperature source Oral, resp. rate 18, height _0  (1.575 m), weight 70.8 kg (156 lb), last menstrual period 09/22/2016, SpO2 92 %.Body mass index is 28.53 kg/m.  General Appearance: Fairly Groomed  Eye Contact:  Good  Speech:  Normal Rate  Volume:  Normal  Mood:  Anxious  Affect:  Appropriate  Thought Process:  Goal Directed  Orientation:  Full (Time, Place, and Person)  Thought Content:  Logical  Suicidal Thoughts:  No  Homicidal Thoughts:  No  Memory:  Immediate;   Good Recent;   Good Remote;   Good  Judgement:  Fair  Insight:  Fair  Psychomotor Activity:  Normal  Concentration:  Concentration: Fair  Recall:  Pearl of Knowledge:  Fair  Language:  Fair  Akathisia:  No  Handed:  Right  AIMS (if indicated):     Assets:  Communication Skills Desire for Improvement Housing Physical Health Resilience Social Support  ADL's:  Intact  Cognition:  WNL  Sleep:        Treatment Plan Summary: Medication management and Plan This is a 26 year old woman with a history of anxiety and depression symptoms and substance abuse. She is having acute worsening of some of her symptoms which she relates in part to feeling guilty about her baby having withdrawal. Also could be related to being here at the end of the pregnancy and just now postpartum. Patient is not presenting as psychotic and is not presenting with suicidal or homicidal or dangerous thoughts. We reviewed several issues. As far as the methadone it is a complicated decision when to taper off and discontinue methadone. I would not advise that she make any changes to that until she goes in and has a frank discussion with the substance abuse treatment staff at the methadone clinic about her overall plan and goals. As far as her anxiety and depression she wants to change antidepressants. I agreed to give her a prescription for paroxetine (her choice) 10 mg which I suggest she take along  with the Zoloft area and she then needs to call her regular provider at [facility] for any further changes  and cross taper. Patient was very interested in getting "something" for anxiety. Given her history of substance abuse I strongly feel it would be inappropriate to give her a prescription for benzodiazepines or other controlled substances. I agreed to give her a prescription for Vistaril 50 mg which can be taken up to twice a day as needed for anxiety. One-month supply of each. Supportive counseling and therapy completed. Patient agrees to the plan.  Disposition: No evidence of imminent risk to self or others at present.   Patient does not meet criteria for psychiatric inpatient admission. Supportive therapy provided about ongoing stressors.  Alethia Berthold, MD 06/24/2017 1:52 PM

## 2017-06-24 NOTE — Clinical Social Work Maternal (Signed)
CLINICAL SOCIAL WORK MATERNAL/CHILD NOTE  Patient Details  Name: Dominique Frost MRN: [id number] Date of Birth: Jan 13, 1991  Date:  06/24/2017  Clinical Social Worker Initiating Note:  Santiago Bumpers, MSW, [place] Date/Time: Initiated:  06/24/17/1512     Child's Name:      Biological Parents:  Mother, Father   Need for Interpreter:  None   Reason for Referral:  Current Substance Use/Substance Use During Pregnancy , Other (Comment) (Possible NICU needs;hx of depression and anxiety without SI)   Address:  [address] [place] [address]    Phone number:  [phone] (home)     Additional phone number: None  Household Members/Support Persons (HM/SP):   Household Member/Support Person 1   HM/SP Name Relationship DOB or Age  HM/SP -[place] Husband Unknown  HM/SP -2        HM/SP -3        HM/SP -4        HM/SP -5        HM/SP -6        HM/SP -7        HM/SP -8          Natural Supports (not living in the home):  Friends, Extended Family, Medical laboratory scientific officer, Armed forces technical officer, Immediate Family, Armed forces training and education officer Supports: Transport planner, Case Metallurgist   Employment: Unemployed   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   Education:  High school graduate   Homebound arranged:    Museum/gallery curator Resources:  Medicaid   Other Resources:  Physicist, medical , [place] Considerations Which May Impact Care:  None reported  Strengths:  Ability to meet basic needs , Compliance with medical plan , Home prepared for child , Understanding of illness, Psychotropic Medications   Psychotropic Medications:  Zoloft, Paxil, Other meds, Methadone (Vistaril)      Pediatrician:       Pediatrician List:   [employer]      Pediatrician Fax Number: Unknown  Risk Factors/Current Problems:  Mental Health Concerns    Cognitive State:  Alert , Linear Thinking , Insightful  , Goal Oriented    Mood/Affect:  Anxious    CSW Assessment: CSW met with the patient at bedside to discuss concerns for possible natal withdrawal from methadone and benzodiazepines. The patient reported that she receives 60 mg of methadone which her provider is tapering with Klonopin. The CSW provided psychoeducation about the addictive qualities of benzodiazepines and the possible withdrawal symptoms for her infant including excessive/uncontrollable crying, difficulty suckling, low appetite, inability to sleep, and slowed weight gain. The patient indicated that her child has some of those symptoms. The CSW alerted the patient that her child may need NICU attention during the healing period. CSW also provided psychoeducation about post-partum anxiety and depression, both of which the patient experienced with her 26 YO post-birth. The patient has a safety plan if she becomes overwhelmed, and her mother who was present with the approval of the patient was attentive and supportive.   The patient lives at home with her 28 YO and her husband. The patient's husband works, and the patient seems to be compliant with her harm reduction plan for opioid treatment. The patient receives methadone from [facility] in [place], and her behavioral health provider is [facility]. The CSW alerted the patient that the baby's  UDS is positive for benzodiazepines and methadone; however, due to the prescription for such, CPS would not be notified. However, the CSW did instruct the patient that if the child's cord blood returned with any other non-prescribed substances, CPS would be contacted. The patient verbalized understanding and willingness to comply with any treatment plan.  CSW Plan/Description:  Engineer, mining , Information/Referral to [employer] , Psychosocial Support and Ongoing Assessment of [place], [place] 06/24/2017, 3:15 PM

## 2017-06-24 NOTE — Clinical Social Work Note (Signed)
CSW received a duplicate consult. As charted, the CSW is following.  Argentina Ponder, MSW, Theresia Majors [phone]

## 2017-06-25 MED ORDER — HYDROXYZINE HCL 50 MG PO TABS: 50.0000 mg | ORAL_TABLET | Freq: Two times a day (BID) | ORAL | 0 refills | Status: DC | PRN

## 2017-06-25 NOTE — Progress Notes (Signed)
Discharge instructions reviewed with patient who verbalized understanding. Prescriptions given, home instructions reviewed. Patient declines Tdap vaccine for now. Denies any questions.

## 2017-06-25 NOTE — Discharge Summary (Signed)
  Pt discharge was delayed until today . Psych has seen and feels she is safe to go home. Wrote for Antideprerssants and no benzodiazepines . She needs to go to the Methadone clinic to start . Baby will stayin hospital for w/ drawal sy,mptoms  Social services has been involved in her care .  Pt will return in 2 weeks to do an interval L/S BTL . [facility] has the Medicaid paperwork .

## 2017-06-25 NOTE — Progress Notes (Signed)
Post Partum Day 2 Subjective: anxious   Objective: Blood pressure 111/68, pulse (!) 110, temperature 99 F (37.2 C), temperature source Oral, resp. rate 20, height  (1.575 m), weight 70.8 kg (156 lb), last menstrual period 09/22/2016, SpO2 98 %.  Physical Exam:  General: alert and cooperative Lochia: appropriate Uterine Fundus: firm Incision: n/a DVT Evaluation: No evidence of DVT seen on physical exam.   Recent Labs  06/22/17 2138 06/23/17 0743  HGB 11.6* 11.6*  HCT 33.9* 33.0*    Assessment/Plan: Discharge home Baby to stay .   LOS: 3 days   Ihor Austin Takota Cahalan 06/25/2017, 2:05 PM

## 2017-06-25 NOTE — Discharge Instructions (Signed)
Please call your doctor or return to the ER if you experience any chest pains, shortness of breath, dizziness, visual changes, fever greater than 101, any heavy bleeding (saturating more than 1 pad per hour), large clots, or foul smelling discharge, any worsening abdominal pain and cramping that is not controlled by pain medication, or any signs of postpartum depression. No tampons, enemas, douches, or sexual intercourse for 6 weeks. Also avoid tub baths, hot tubs, or swimming for 6 weeks.       Home Care Instructions for Mom ACTIVITY  Gradually return to your regular activities.  Let yourself rest. Nap while your baby sleeps.  Avoid lifting anything that is heavier than 10 lb (4.5 kg) until your health care provider says it is okay.  Avoid activities that take a lot of effort and energy (are strenuous) until approved by your health care provider. Walking at a slow-to-moderate pace is usually safe.  If you had a cesarean delivery: ? Do not vacuum, climb stairs, or drive a car for 4-6 weeks. ? Have someone help you at home until you feel like you can do your usual activities yourself. ? Do exercises as told by your health care provider, if this applies.  VAGINAL BLEEDING You may continue to bleed for 4-6 weeks after delivery. Over time, the amount of blood usually decreases and the color of the blood usually gets lighter. However, the flow of bright red blood may increase if you have been too active. If you need to use more than one pad in an hour because your pad gets soaked, or if you pass a large clot:  Lie down.  Raise your feet.  Place a cold compress on your lower abdomen.  Rest.  Call your health care provider.  If you are breastfeeding, your period should return anytime between 8 weeks after delivery and the time that you stop breastfeeding. If you are not breastfeeding, your period should return 6-8 weeks after delivery. PERINEAL CARE The perineal area, or perineum, is  the part of your body between your thighs. After delivery, this area needs special care. Follow these instructions as told by your health care provider.  Take warm tub baths for 15-20 minutes.  Use medicated pads and pain-relieving sprays and creams as told.  Do not use tampons or douches until vaginal bleeding has stopped.  Each time you go to the bathroom: ? Use a peri bottle. ? Change your pad. ? Use towelettes in place of toilet paper until your stitches have healed.  Do Kegel exercises every day. Kegel exercises help to maintain the muscles that support the vagina, bladder, and bowels. You can do these exercises while you are standing, sitting, or lying down. To do Kegel exercises: ? Tighten the muscles of your abdomen and the muscles that surround your birth canal. ? Hold for a few seconds. ? Relax. ? Repeat until you have done this 5 times in a row.  To prevent hemorrhoids from developing or getting worse: ? Drink enough fluid to keep your urine clear or pale yellow. ? Avoid straining when having a bowel movement. ? Take over-the-counter medicines and stool softeners as told by your health care provider.  BREAST CARE  Wear a tight-fitting bra.  Avoid taking over-the-counter pain medicine for breast discomfort.  Apply ice to the breasts to help with discomfort as needed: ? Put ice in a plastic bag. ? Place a towel between your skin and the bag. ? Leave the ice on for 20  minutes or as told by your health care provider.  NUTRITION  Eat a well-balanced diet.  Do not try to lose weight quickly by cutting back on calories.  Take your prenatal vitamins until your postpartum checkup or until your health care provider tells you to stop.  POSTPARTUM DEPRESSION You may find yourself crying for no apparent reason and unable to cope with all of the changes that come with having a newborn. This mood is called postpartum depression. Postpartum depression happens because your  hormone levels change after delivery. If you have postpartum depression, get support from your partner, friends, and family. If the depression does not go away on its own after several weeks, contact your health care provider. BREAST SELF-EXAM Do a breast self-exam each month, at the same time of the month. If you are breastfeeding, check your breasts just after a feeding, when your breasts are less full. If you are breastfeeding and your period has started, check your breasts on day 5, 6, or 7 of your period. Report any lumps, bumps, or discharge to your health care provider. Know that breasts are normally lumpy if you are breastfeeding. This is temporary, and it is not a health risk. INTIMACY AND SEXUALITY Avoid sexual activity for at least 3-4 weeks after delivery or until the brownish-red vaginal flow is completely gone. If you want to avoid pregnancy, use some form of birth control. You can get pregnant after delivery, even if you have not had your period. SEEK MEDICAL CARE IF:  You feel unable to cope with the changes that a child brings to your life, and these feelings do not go away after several weeks.  You notice a lump, a bump, or discharge on your breast.  SEEK IMMEDIATE MEDICAL CARE IF:  Blood soaks your pad in 1 hour or less.  You have: ? Severe pain or cramping in your lower abdomen. ? A bad-smelling vaginal discharge. ? A fever that is not controlled by medicine. ? A fever, and an area of your breast is red and sore. ? Pain or redness in your calf. ? Sudden, severe chest pain. ? Shortness of breath. ? Painful or bloody urination. ? Problems with your vision.  You vomit for 12 hours or longer.  You develop a severe headache.  You have serious thoughts about hurting yourself, your child, or anyone else.  This information is not intended to replace advice given to you by your health care provider. Make sure you discuss any questions you have with your health care  provider. Document Released: 09/15/2000 Document Revised: 02/24/2016 Document Reviewed: 03/22/2015 Elsevier Interactive Patient Education  2017 [employer].

## 2017-07-01 ENCOUNTER — Ambulatory Visit
Admission: EM | Admit: 2017-07-01 | Discharge: 2017-07-01 | Disposition: A | Discharge status: Other Acute Inpatient Hospital | Payer: Medicaid Other | Attending: Family Medicine | Admitting: Family Medicine

## 2017-07-01 ENCOUNTER — Encounter: Payer: Self-pay | Admitting: Gynecology

## 2017-07-01 DIAGNOSIS — R102 Pelvic and perineal pain: Secondary | ICD-10-CM | POA: Diagnosis not present

## 2017-07-01 DIAGNOSIS — F53 Postpartum depression: Secondary | ICD-10-CM

## 2017-07-01 DIAGNOSIS — O99345 Other mental disorders complicating the puerperium: Secondary | ICD-10-CM

## 2017-07-01 DIAGNOSIS — M545 Low back pain, unspecified: Secondary | ICD-10-CM

## 2017-07-01 DIAGNOSIS — F419 Anxiety disorder, unspecified: Secondary | ICD-10-CM | POA: Diagnosis not present

## 2017-07-01 NOTE — Discharge Instructions (Signed)
Due to your history and current symptoms, recommend evaluation at [facility] ER for pelvic pain 1 week post-partum, as well as for severe anxiety and mood disorder.

## 2017-07-01 NOTE — ED Provider Notes (Signed)
[facility]    CSN: [id number] Arrival date & time: 07/01/17  1544     History   Chief Complaint Chief Complaint  Patient presents with  . Anxiety    HPI Dominique Frost is a 26 y.o. female.   26 year old female presents with concern over severe depression and anxiety. She just gave birth to a baby boy on 06/22/17 at [facility] who is in the NICU for multiple health issues. She was on Methadone  daily but indicates she stopped after delivery. She has a history of various addictive behaviors including alcohol in 2015, opioids and benzodiazepines in 2015 until present. She has a a mixed mood disorder and is currently being weaned off Zoloft and increasing her dose of Paxil. She has a psychiatrist who can not see her for another month. At [facility] at discharge, they gave her Vistaril  twice a day for anxiety but it is not helping. She is very anxious about her baby boy and how long he will be in the NICU. She has a husband and 3 other children at home. She is also experiencing lower abdominal pain and right sided lower back pain. Uncertain if related to delivery- requesting pain medication and Ativan for anxiety.    The history is provided by the patient.    Past Medical History:  Diagnosis Date  . Borderline personality disorder   . Depression   . Generalized anxiety disorder   . PTSD (post-traumatic stress disorder)     Patient Active Problem List   Diagnosis Date Noted  . Adjustment disorder with mixed anxiety and depressed mood 06/24/2017  . Opiate abuse, continuous 06/24/2017  . Encounter for planned induction of labor 06/22/2017  . Pregnancy 04/25/2017  . Labor and delivery indication for care or intervention 03/24/2017  . Narcotic dependency, continuous (HCC) 03/08/2017  . SVD (spontaneous vaginal delivery) 04/21/2015  . Drug abuse, opioid type 08/05/2014  . Alcohol addiction (HCC) 03/11/2014  . Benzodiazepine dependence (HCC) 03/11/2014  . 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 Borderline personality disorder 10/30/2013  . Cannabis abuse, continuous 10/30/2013  . Neurosis, posttraumatic 10/30/2013  . Compulsive tobacco user syndrome 09/20/2012  . 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 DILATION AND CURETTAGE OF UTERUS      OB History    Gravida Para Term Preterm AB Living   SAB TAB Ectopic Multiple Live Births   1     0 1      Obstetric Comments   D&C after SAB       Home Medications    Prior to Admission medications   Medication Sig Start Date End Date Taking? Authorizing Provider  hydrOXYzine (ATARAX/VISTARIL) 50 MG tablet Take 1 tablet (50 mg total) by mouth 2 (two) times daily as needed (anxiety). 06/25/17  Yes Schermerhorn, Ihor Austin, MD  hydrOXYzine (VISTARIL) 50 MG capsule Take 1 capsule (50 mg total) by mouth 2 (two) times daily as needed for anxiety. 06/24/17  Yes Clapacs, Jackquline Denmark, MD  methadone (DOLOPHINE) 10 MG/ML solution Take 60 mg by mouth 1 day or 1 dose.   Yes [provider]  PARoxetine (PAXIL) 10 MG tablet Take 1 tablet (10 mg total) by mouth daily. 06/24/17 06/24/18 Yes Clapacs, Jackquline Denmark, MD  Prenatal Multivit-Min-Fe-FA (PRENATAL VITAMINS PO) Take by mouth.   Yes [provider]  sertraline (ZOLOFT) 100 MG tablet Take 1 1/2 tablets daily 06/23/17  Yes [name], CNM    Family History Family History  Problem Relation Age of Onset  . Cancer Neg Hx   . Diabetes Neg Hx   . Heart disease Neg Hx     Social History Social History  Substance Use Topics  . Smoking status: Heavy Tobacco Smoker    Packs/day: 0.50    Years: 10.00    Types: Cigarettes  . Smokeless tobacco: Never Used  . Alcohol use No     Allergies   Clonidine and Vancomycin   Review of Systems Review of Systems  Constitutional: Negative for appetite change, chills and fever.  Respiratory:  Positive for shortness of breath. Negative for cough, chest tightness and wheezing.   Cardiovascular: Positive for palpitations.  Gastrointestinal: Positive for abdominal pain. Negative for nausea and vomiting.  Genitourinary: Positive for pelvic pain. Negative for difficulty urinating and flank pain.  Musculoskeletal: Positive for back pain. Negative for arthralgias, myalgias and neck pain.  Skin: Negative for rash and wound.  Neurological: Negative for dizziness, tremors, seizures, syncope, weakness, light-headedness and numbness.  Psychiatric/Behavioral: Negative for suicidal ideas. The patient is nervous/anxious.      Physical Exam Triage Vital Signs ED Triage Vitals  Enc Vitals Group     BP 07/01/17 1654 115/73     Pulse Rate 07/01/17 1654 99     Resp 07/01/17 1654 16     Temp 07/01/17 1654 98.1 F (36.7 C)     Temp src --      SpO2 07/01/17 1654 100 %     Weight 07/01/17 1657 156 lb (70.8 kg)     Height --      Head Circumference --      Peak Flow --      Pain Score 07/01/17 1657 7     Pain Loc --      Pain Edu? --      Excl. in GC? --    No data found.   Updated Vital Signs BP 115/73 (BP Location: Right Arm)   Pulse 99   Temp 98.1 F (36.7 C)   Resp 16   Wt 156 lb (70.8 kg)   LMP 09/22/2016   SpO2 100%   Breastfeeding? Unknown   BMI 28.53 kg/m   Visual Acuity Right Eye Distance:   Left Eye Distance:   Bilateral Distance:    Right Eye Near:   Left Eye Near:    Bilateral Near:     Physical Exam  Constitutional: She is oriented to person, place, and time. She appears well-developed and well-nourished. She is cooperative. No distress.  HENT:  Head: Normocephalic and atraumatic.  Nose: Nose normal.  Eyes: Conjunctivae and EOM are normal.  Neck: Normal range of motion.  Cardiovascular: Regular rhythm and normal pulses.  Tachycardia present.   Pulmonary/Chest: Effort normal and breath sounds normal. No respiratory distress. She has no decreased breath  sounds. She has no wheezes. She has no rhonchi.  Abdominal: Soft. Bowel sounds are normal. There is tenderness in the right lower quadrant, periumbilical area and left lower quadrant. There is no rigidity, no rebound, no guarding and no CVA tenderness.    Musculoskeletal: Normal range of motion.       Lumbar back: She exhibits tenderness and pain. She exhibits normal range of motion, no swelling, no edema, no deformity, no spasm and normal pulse.       Back:  Right lower  lumbar tenderness. Has full range of motion of back but with pain. No neuro deficits noted.   Neurological: She is alert and oriented to person, place, and time. She has normal strength. No sensory deficit.  Skin: Skin is warm and dry.  Psychiatric: Her speech is normal. Thought content normal. Her mood appears anxious. She expresses no homicidal and no suicidal ideation. She expresses no suicidal plans and no homicidal plans.  She appears nervous and the information she has written down on her intake sheet does not match what she is stating regarding medications and history.      UC Treatments / Results  Labs (all labs ordered are listed, but only abnormal results are displayed) Labs Reviewed - No data to display  EKG  EKG Interpretation None       Radiology No results found.  Procedures Procedures (including critical care time)  Medications Ordered in UC Medications - No data to display   Initial Impression / Assessment and Plan / UC Course  I have reviewed the triage vital signs and the nursing notes.  Pertinent labs & imaging results that were available during my care of the patient were reviewed by me and considered in my medical decision making (see chart for details).    Discussed with patient that due to her history of substance abuse, I am unable to prescribe any pain medication or controlled anxiety medication such as Ativan in this setting. Since she is experiencing pelvic pain shortly after  vaginal delivery, recommend additional evaluation at the ER since pain may be due to various etiologies. Patient agreeable to going [facility] ER for further evaluation. She will drive herself to the hospital.     Final Clinical Impressions(s) / UC Diagnoses   Final diagnoses:  Anxiety  Post partum depression  Acute pelvic pain  Lumbar back pain    New Prescriptions Discharge Medication List as of 07/01/2017  5:24 PM       Controlled Substance Prescriptions [place] Controlled Substance Registry consulted? Yes, I have consulted the [place] Controlled Substances Registry for this patient. Her last Rx was for Tylenol #3 in May 2018. Due to her history listed in Epic, I do not feel comfortable prescribing any controlled medication for this patient without additional evaluation.    Sudie Grumbling, NP 07/01/17 [phone]

## 2017-07-01 NOTE — ED Triage Notes (Signed)
Per patient anxiety / pain.

## 2017-07-04 ENCOUNTER — Emergency Department
Admission: EM | Admit: 2017-07-04 | Discharge: 2017-07-05 | Disposition: A | Discharge status: Home / Self Care | Payer: Medicaid Other | Attending: Student in an Organized Health Care Education/Training Program | Admitting: Student in an Organized Health Care Education/Training Program

## 2017-07-04 DIAGNOSIS — N939 Abnormal uterine and vaginal bleeding, unspecified: Secondary | ICD-10-CM | POA: Diagnosis not present

## 2017-07-04 DIAGNOSIS — F17210 Nicotine dependence, cigarettes, uncomplicated: Secondary | ICD-10-CM | POA: Diagnosis not present

## 2017-07-04 DIAGNOSIS — Z79891 Long term (current) use of opiate analgesic: Secondary | ICD-10-CM | POA: Insufficient documentation

## 2017-07-04 DIAGNOSIS — R102 Pelvic and perineal pain: Secondary | ICD-10-CM | POA: Diagnosis present

## 2017-07-04 LAB — URINALYSIS, COMPLETE (UACMP) WITH MICROSCOPIC
BILIRUBIN URINE: NEGATIVE
Bacteria, UA: NONE SEEN
GLUCOSE, UA: NEGATIVE mg/dL
HGB URINE DIPSTICK: NEGATIVE
KETONES UR: NEGATIVE mg/dL
NITRITE: NEGATIVE
PH: 6 (ref 5.0–8.0)
Protein, ur: NEGATIVE mg/dL
SPECIFIC GRAVITY, URINE: 1.005 (ref 1.005–1.030)

## 2017-07-04 LAB — COMPREHENSIVE METABOLIC PANEL
ALT: 26 U/L (ref 14–54)
AST: 28 U/L (ref 15–41)
Albumin: 3.5 g/dL (ref 3.5–5.0)
Alkaline Phosphatase: 80 U/L (ref 38–126)
Anion gap: 11 (ref 5–15)
BILIRUBIN TOTAL: 0.8 mg/dL (ref 0.3–1.2)
BUN: 12 mg/dL (ref 6–20)
CALCIUM: 9.5 mg/dL (ref 8.9–10.3)
CHLORIDE: 100 mmol/L — AB (ref 101–111)
CO2: 28 mmol/L (ref 22–32)
CREATININE: 0.77 mg/dL (ref 0.44–1.00)
Glucose, Bld: 121 mg/dL — ABNORMAL HIGH (ref 65–99)
Potassium: 3.8 mmol/L (ref 3.5–5.1)
Sodium: 139 mmol/L (ref 135–145)
TOTAL PROTEIN: 6.8 g/dL (ref 6.5–8.1)

## 2017-07-04 LAB — CBC
HCT: 38.1 % (ref 35.0–47.0)
Hemoglobin: 13.1 g/dL (ref 12.0–16.0)
MCH: 30.7 pg (ref 26.0–34.0)
MCHC: 34.4 g/dL (ref 32.0–36.0)
MCV: 89.4 fL (ref 80.0–100.0)
PLATELETS: 246 10*3/uL (ref 150–440)
RBC: 4.26 MIL/uL (ref 3.80–5.20)
RDW: 13.6 % (ref 11.5–14.5)
WBC: 11.7 10*3/uL — AB (ref 3.6–11.0)

## 2017-07-04 LAB — LIPASE, BLOOD: LIPASE: 18 U/L (ref 11–51)

## 2017-07-04 MED ORDER — ACETAMINOPHEN 500 MG PO TABS
1000.0000 mg | ORAL_TABLET | Freq: Once | ORAL | Status: AC
  Administered 2017-07-04: 1000 mg via ORAL
  Filled 2017-07-04: qty 2

## 2017-07-04 MED ORDER — TRAMADOL HCL 50 MG PO TABS
50.0000 mg | ORAL_TABLET | Freq: Once | ORAL | Status: AC
  Administered 2017-07-04: 50 mg via ORAL
  Filled 2017-07-04: qty 1

## 2017-07-04 NOTE — ED Provider Notes (Signed)
[facility] Emergency Department Provider Note    First MD Initiated Contact with Patient 07/04/17 2324     (approximate)  I have reviewed the triage vital signs and the nursing notes.   HISTORY  Chief Complaint Abdominal Pain    HPI Dominique Frost is a 26 y.o. female presents status post spontaneous vaginal delivery on September 22 with chief complaint of persistent suprapubic discomfort and brown" after birth he "discharge that has been persistent since delivery. No fevers. Has nausea vomiting or diarrhea. Her pregnancy was complicated by polysubstance abuse and her baby is still in the NICU due to narcotic withdrawal. Patient denies any chest pain or shortness of breath. No interval trauma.   Past Medical History:  Diagnosis Date  . Borderline personality disorder (HCC)   . Depression   . Generalized anxiety disorder   . PTSD (post-traumatic stress disorder)    Family History  Problem Relation Age of Onset  . Cancer Neg Hx   . Diabetes Neg Hx   . Heart disease Neg Hx    Past Surgical History:  Procedure Laterality Date  . DILATION AND CURETTAGE OF UTERUS     Patient Active Problem List   Diagnosis Date Noted  . Adjustment disorder with mixed anxiety and depressed mood 06/24/2017  . Opiate abuse, continuous (HCC) 06/24/2017  . Encounter for planned induction of labor 06/22/2017  . Pregnancy 04/25/2017  . Labor and delivery indication for care or intervention 03/24/2017  . Narcotic dependency, continuous (HCC) 03/08/2017  . SVD (spontaneous vaginal delivery) 04/21/2015  . Drug abuse, opioid type (HCC) 08/05/2014  . Alcohol addiction (HCC) 03/11/2014  . Benzodiazepine dependence (HCC) 03/11/2014  . 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 Borderline personality disorder (HCC) 10/30/2013  . Cannabis abuse, continuous 10/30/2013  . Neurosis, posttraumatic 10/30/2013  . Compulsive tobacco user syndrome 09/20/2012  . Barbiturate and  similarly acting sedative or hypnotic dependence, unspecified abuse 10/01/2011      Prior to Admission medications   Medication Sig Start Date End Date Taking? Authorizing Provider  hydrOXYzine (ATARAX/VISTARIL) 50 MG tablet Take 1 tablet (50 mg total) by mouth 2 (two) times daily as needed (anxiety). 06/25/17   Schermerhorn, Ihor Austin, MD  hydrOXYzine (VISTARIL) 50 MG capsule Take 1 capsule (50 mg total) by mouth 2 (two) times daily as needed for anxiety. 06/24/17   Clapacs, Jackquline Denmark, MD  methadone (DOLOPHINE) 10 MG/ML solution Take 60 mg by mouth 1 day or 1 dose.    [provider]  PARoxetine (PAXIL) 10 MG tablet Take 1 tablet (10 mg total) by mouth daily. 06/24/17 06/24/18  Clapacs, Jackquline Denmark, MD  Prenatal Multivit-Min-Fe-FA (PRENATAL VITAMINS PO) Take by mouth.    [provider]  sertraline (ZOLOFT) 100 MG tablet Take 1 1/2 tablets daily 06/23/17   [name], CNM  traMADol (ULTRAM) 50 MG tablet Take 1 tablet (50 mg total) by mouth every 6 (six) hours as needed. 07/05/17 07/05/18  Willy Eddy, MD    Allergies Clonidine and Vancomycin    Social History Social History  Substance Use Topics  . Smoking status: Heavy Tobacco Smoker    Packs/day: 0.50    Years: 10.00    Types: Cigarettes  . Smokeless tobacco: Never Used  . Alcohol use No    Review of Systems Patient denies headaches, rhinorrhea, blurry vision, numbness, shortness of breath, chest pain, edema, cough, abdominal pain, nausea, vomiting, diarrhea, dysuria, fevers, rashes or hallucinations unless otherwise stated above in  HPI. ____________________________________________   PHYSICAL EXAM:  VITAL SIGNS: Vitals:   07/04/17 2156 07/04/17 2344  BP: 138/84 (!) 114/55  Pulse: 88 72  Resp: 16 18  Temp: 98.7 F (37.1 C)   SpO2: 99% 98%    Constitutional: Alert and oriented. Well appearing and in no acute distress. Eyes: Conjunctivae are normal.  Head: Atraumatic. Nose: No  congestion/rhinnorhea. Mouth/Throat: Mucous membranes are moist.   Neck: No stridor. Painless ROM.  Cardiovascular: Normal rate, regular rhythm. Grossly normal heart sounds.  Good peripheral circulation. Respiratory: Normal respiratory effort.  No retractions. Lungs CTAB. Gastrointestinal: Soft with mild suprapubic ttp. No distention. No abdominal bruits. No CVA tenderness. Musculoskeletal: No lower extremity tenderness nor edema.  No joint effusions. Neurologic:  Normal speech and language. No gross focal neurologic deficits are appreciated. No facial droop Skin:  Skin is warm, dry and intact. No rash noted. Psychiatric: appropriate ____________________________________________   LABS (all labs ordered are listed, but only abnormal results are displayed)  Results for orders placed or performed during the hospital encounter of 07/04/17 (from the past 24 hour(s))  Lipase, blood     Status: None   Collection Time: 07/04/17  8:13 PM  Result Value Ref Range   Lipase 18 11 - 51 U/L  Comprehensive metabolic panel     Status: Abnormal   Collection Time: 07/04/17  8:13 PM  Result Value Ref Range   Sodium 139 135 - 145 mmol/L   Potassium 3.8 3.5 - 5.1 mmol/L   Chloride 100 (L) 101 - 111 mmol/L   CO2 28 22 - 32 mmol/L   Glucose, Bld 121 (H) 65 - 99 mg/dL   BUN 12 6 - 20 mg/dL   Creatinine, Ser 4.09 0.44 - 1.00 mg/dL   Calcium 9.5 8.9 - 81.1 mg/dL   Total Protein 6.8 6.5 - 8.1 g/dL   Albumin 3.5 3.5 - 5.0 g/dL   AST 28 15 - 41 U/L   ALT 26 14 - 54 U/L   Alkaline Phosphatase 80 38 - 126 U/L   Total Bilirubin 0.8 0.3 - 1.2 mg/dL   GFR calc non Af Amer >60 >60 mL/min   GFR calc Af Amer >60 >60 mL/min   Anion gap 11 5 - 15  CBC     Status: Abnormal   Collection Time: 07/04/17  8:13 PM  Result Value Ref Range   WBC 11.7 (H) 3.6 - 11.0 K/uL   RBC 4.26 3.80 - 5.20 MIL/uL   Hemoglobin 13.1 12.0 - 16.0 g/dL   HCT 91.4 78.2 - 95.6 %   MCV 89.4 80.0 - 100.0 fL   MCH 30.7 26.0 - 34.0 pg    MCHC 34.4 32.0 - 36.0 g/dL   RDW 21.3 08.6 - 57.8 %   Platelets 246 150 - 440 K/uL  Urinalysis, Complete w Microscopic     Status: Abnormal   Collection Time: 07/04/17  8:13 PM  Result Value Ref Range   Color, Urine YELLOW (A) YELLOW   APPearance CLEAR (A) CLEAR   Specific Gravity, Urine 1.005 1.005 - 1.030   pH 6.0 5.0 - 8.0   Glucose, UA NEGATIVE NEGATIVE mg/dL   Hgb urine dipstick NEGATIVE NEGATIVE   Bilirubin Urine NEGATIVE NEGATIVE   Ketones, ur NEGATIVE NEGATIVE mg/dL   Protein, ur NEGATIVE NEGATIVE mg/dL   Nitrite NEGATIVE NEGATIVE   Leukocytes, UA TRACE (A) NEGATIVE   RBC / HPF 0-5 0 - 5 RBC/hpf   WBC, UA 0-5 0 - 5 WBC/hpf  Bacteria, UA NONE SEEN NONE SEEN   Squamous Epithelial / LPF 0-5 (A) NONE SEEN   Ca Oxalate Crys, UA PRESENT   hCG, quantitative, pregnancy     Status: Abnormal   Collection Time: 07/04/17  8:13 PM  Result Value Ref Range   hCG, Beta Chain, Quant, S 18 (H) <5 mIU/mL   ____________________________________________ ____________________________________________  RADIOLOGY  I personally reviewed all radiographic images ordered to evaluate for the above acute complaints and reviewed radiology reports and findings.  These findings were personally discussed with the patient.  Please see medical record for radiology report.  ____________________________________________   PROCEDURES  Procedure(s) performed:  Procedures    Critical Care performed: no ____________________________________________   INITIAL IMPRESSION / ASSESSMENT AND PLAN / ED COURSE  Pertinent labs & imaging results that were available during my care of the patient were reviewed by me and considered in my medical decision making (see chart for details).  DDX: retained poc, endometritis, pid, uti, msk strain   MIRJANA TARLETON is a 26 y.o. who presents to the ED with abdominal discomfort and discharge in the recent postpartum state. Patient well-appearing and in no acute distress.  Afebrile. Blood work is reassuring. Ultrasound ordered to evaluate for evidence of retained products Concepcin shows appropriate postpartum state. She is afebrile with no leukocytosis this seems less consistent with endometritis. Her abdominal exam is not consistent with appendicitis or abscess other process ht would be indicating need for CT imaging.  Patient was able to tolerate PO and was able to ambulate with a steady gait.  Have discussed with the patient and available family all diagnostics and treatments performed thus far and all questions were answered to the best of my ability. The patient demonstrates understanding and agreement with plan.      ____________________________________________   FINAL CLINICAL IMPRESSION(S) / ED DIAGNOSES  Final diagnoses:  Vaginal bleeding, abnormal      NEW MEDICATIONS STARTED DURING THIS VISIT:  New Prescriptions   TRAMADOL (ULTRAM) 50 MG TABLET    Take 1 tablet (50 mg total) by mouth every 6 (six) hours as needed.     Note:  This document was prepared using Dragon voice recognition software and may include unintentional dictation errors.    Willy Eddy, MD 07/05/17 [phone]

## 2017-07-04 NOTE — ED Notes (Signed)
Pt reports increased cramping and abdominal pain. Pt reports she is still spotting but no changes in amount/color.

## 2017-07-04 NOTE — ED Triage Notes (Signed)
Pt presents to ED via POV with c/o suprapubic pain and passing clots  s/p vaginal delivery on Sept 22nd. Pt denies N/V/D or fever, no CP or SHOB. Pt states she was seen at [facility] last night for same but left before results were available. Pt reports "brown, after-birthy" d/c for the last 2 days. Pt is A&O, in NAD; RR even, regular and unlabored; skin color/temp is WNL.

## 2017-07-05 ENCOUNTER — Emergency Department: Payer: Medicaid Other

## 2017-07-05 LAB — HCG, QUANTITATIVE, PREGNANCY: hCG, Beta Chain, Quant, S: 18 m[IU]/mL — ABNORMAL HIGH (ref <5)

## 2017-07-05 MED ORDER — TRAMADOL HCL 50 MG PO TABS: 50.0000 mg | ORAL_TABLET | Freq: Four times a day (QID) | ORAL | 0 refills | Status: AC | PRN

## 2017-07-05 NOTE — Discharge Instructions (Signed)

## 2017-07-22 IMAGING — US US OB TRANSVAGINAL
1 series · 14 of 28 positions shown · non-contrast
Comparison: 11/09/2016

CLINICAL DATA: Low back pain and vaginal bleeding for 1 day.

EXAM:
OBSTETRIC <14 WK US AND TRANSVAGINAL OB US
TECHNIQUE: Both transabdominal and transvaginal ultrasound examinations were
performed for complete evaluation of the gestation as well as the
maternal uterus, adnexal regions, and pelvic cul-de-sac.
Transvaginal technique was performed to assess early pregnancy.

[Series 1: us ob transvaginal · 0.15mm/px · 14 of 116 slices shown]
[im 5/116]
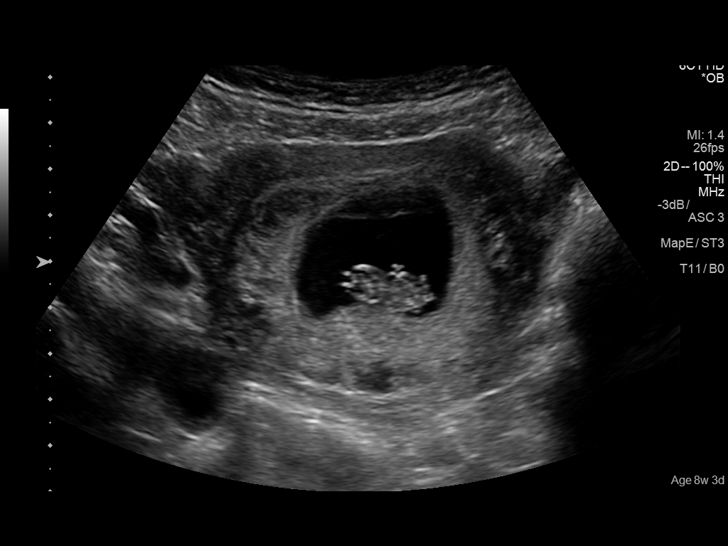
[im 13/116]
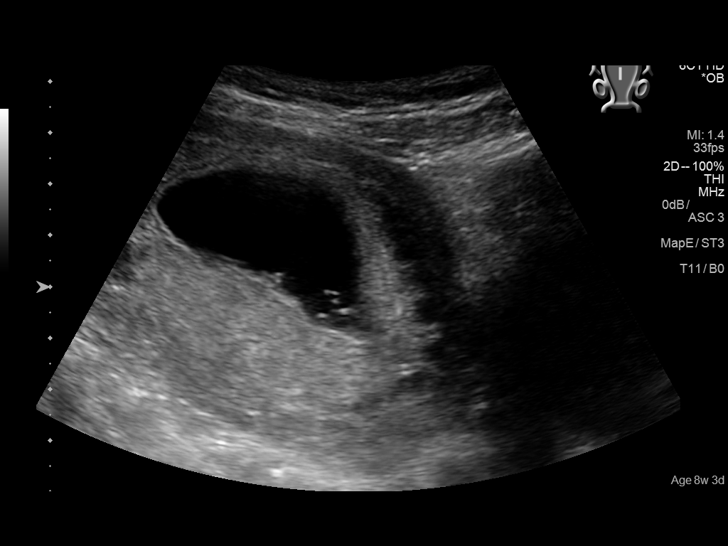
[im 22/116]
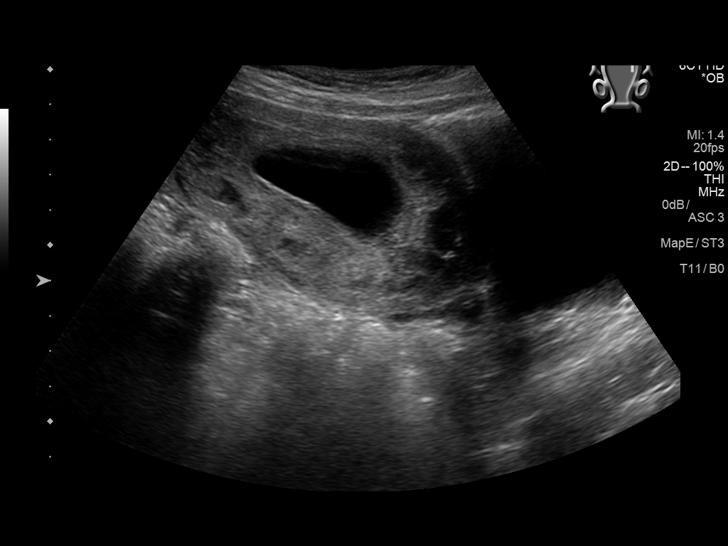
[im 30/116]
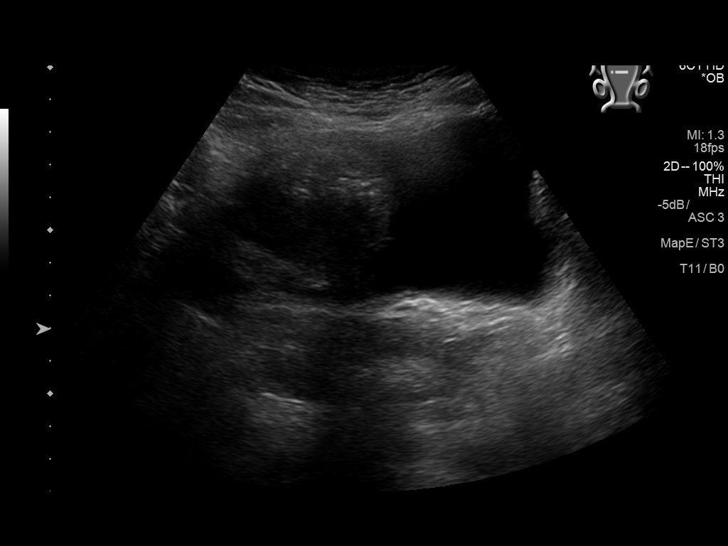
[im 39/116]
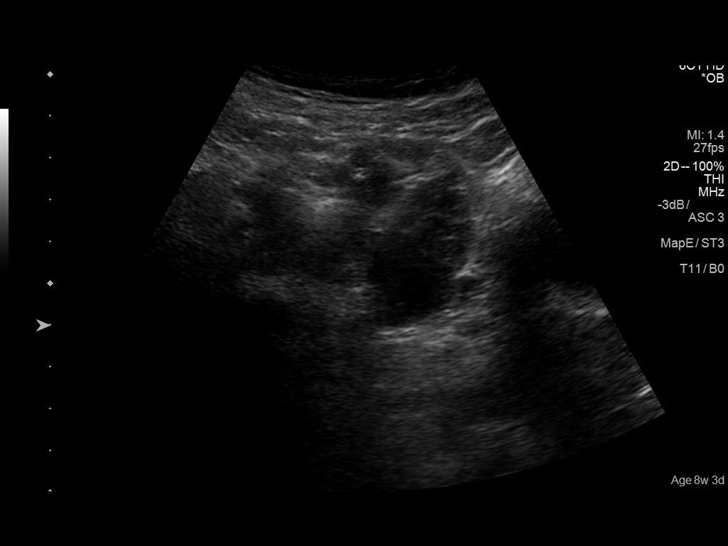
[im 47/116]
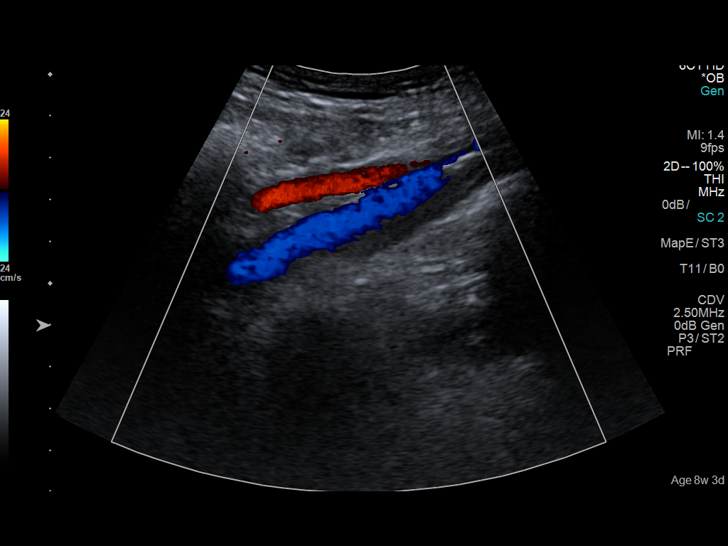
[im 56/116]
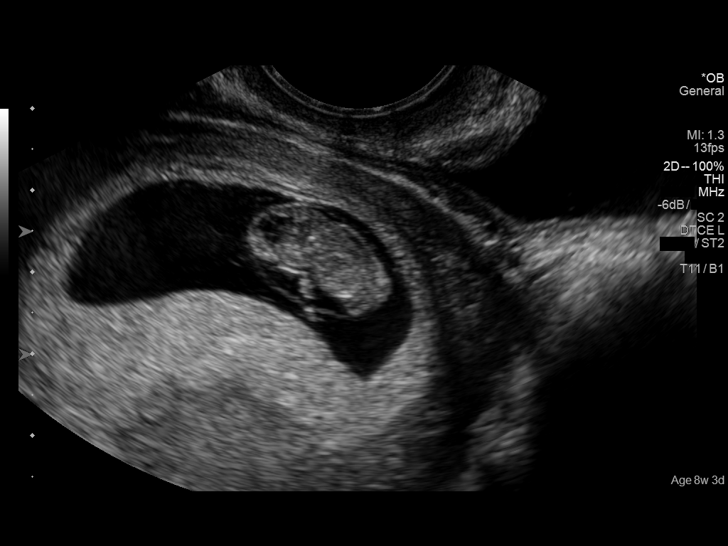
[im 64/116]
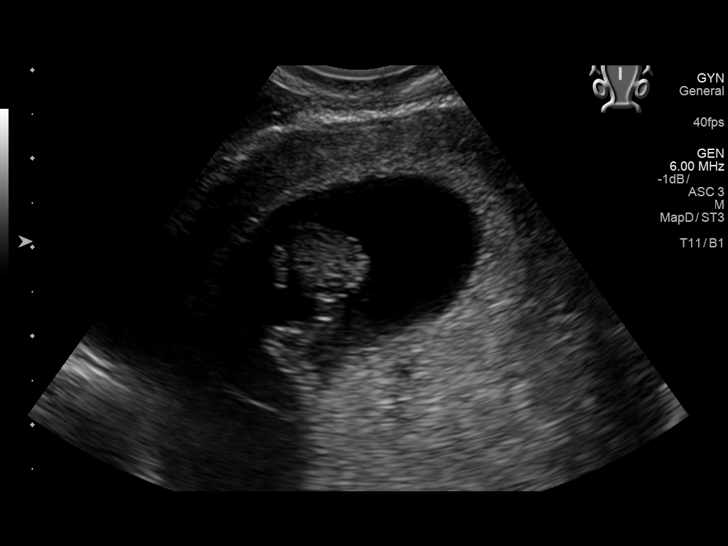
[im 73/116]
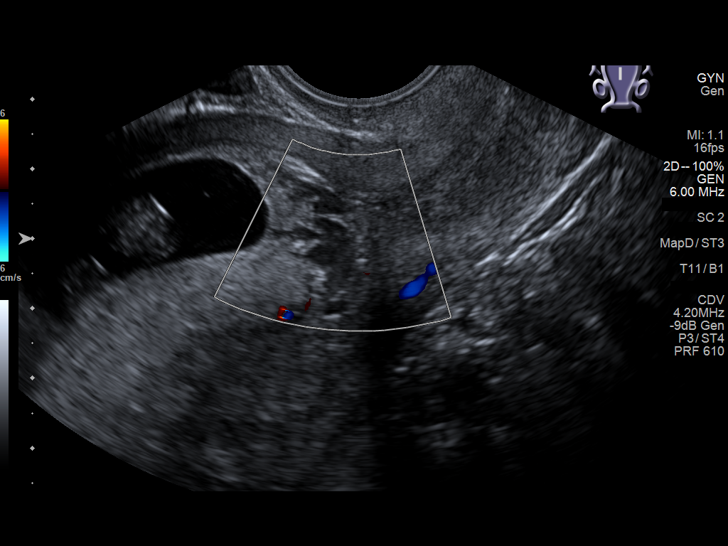
[im 81/116]
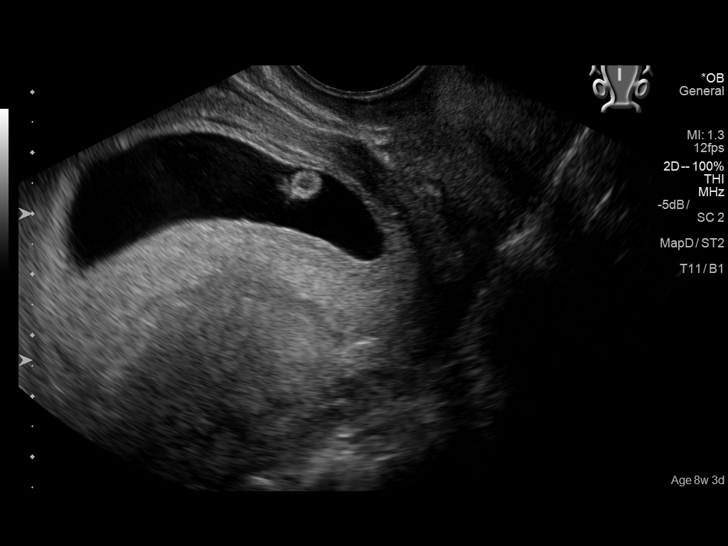
[im 90/116]
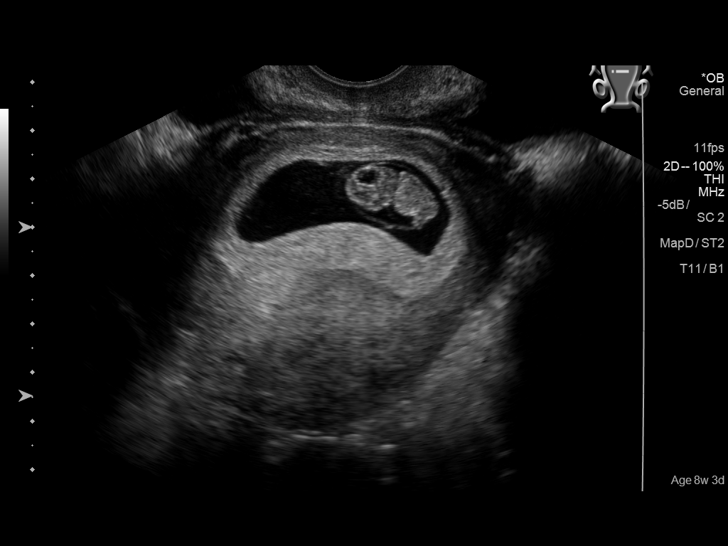
[im 98/116]
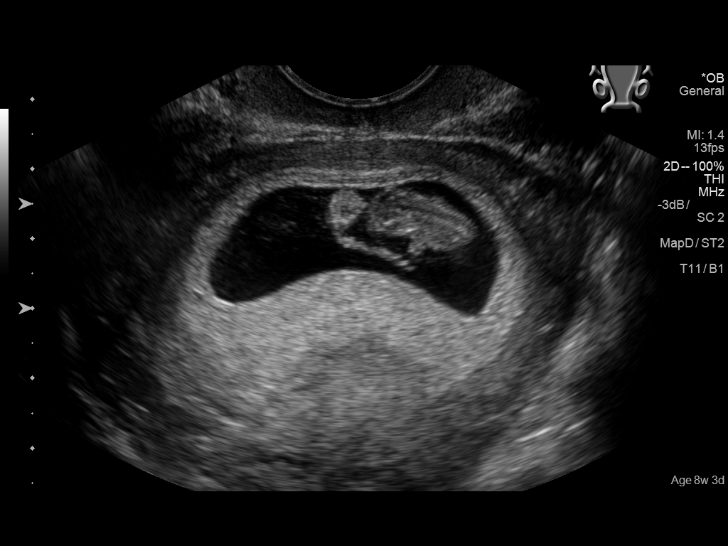
[im 107/116]
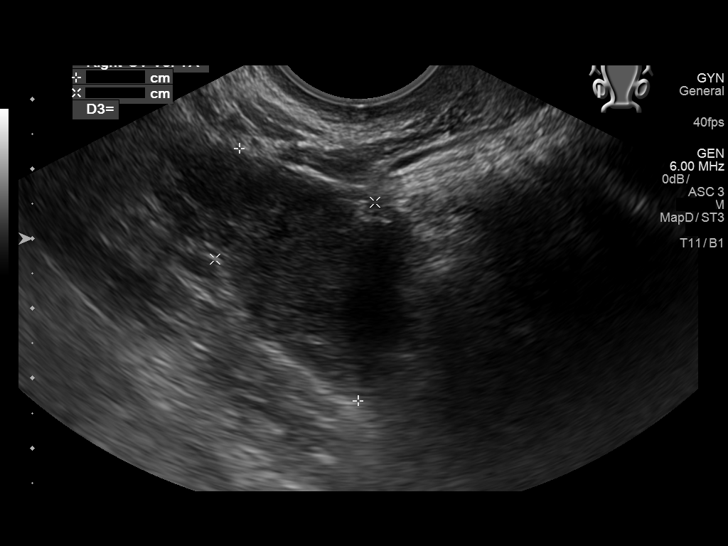
[im 116/116]
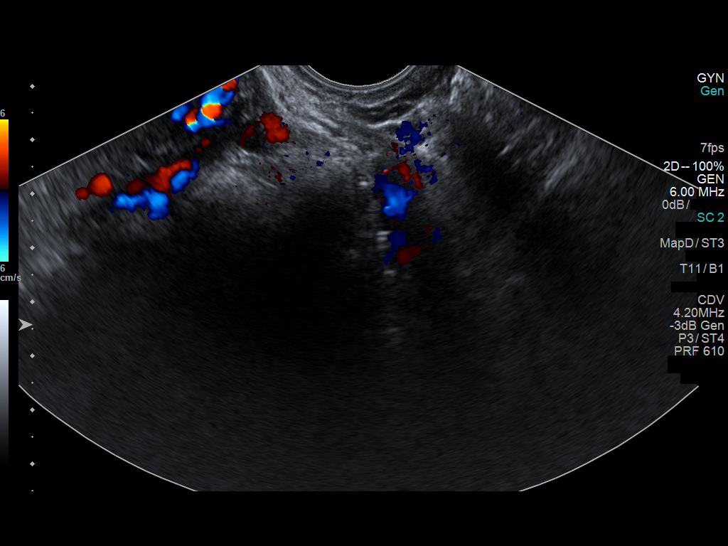

[14 of 28 positions shown; findings below may reference images not displayed]

FINDINGS: Intrauterine gestational sac: Single.

Yolk sac:  Present.

Embryo: Present. Small amount of echogenic material ventral to the
fetal abdomen, incompletely evaluated on this emergent first
trimester ultrasound though could reflect normal embryological
herniation of bowel. Ultrasound later in pregnancy can further
evaluate this and other fetal anatomy.

Cardiac Activity: Present

Heart Rate: 163  bpm

CRL:  20.2  mm   8 w   4 d                  US EDC: 06/30/2017

Subchorionic hemorrhage:  Small.

Maternal uterus/adnexae: 3.7 cm intramural uterine fibroid as
previously seen. Normal appearance of the right ovary. Left ovary
not visualized due to bowel.
IMPRESSION: Single living intrauterine pregnancy as above.

## 2017-12-13 LAB — HM PAP SMEAR: HM Pap smear: NEGATIVE

## 2018-10-11 ENCOUNTER — Other Ambulatory Visit: Payer: Self-pay

## 2018-10-11 ENCOUNTER — Encounter: Payer: Self-pay | Admitting: Emergency Medicine

## 2018-10-11 ENCOUNTER — Emergency Department
Admission: EM | Admit: 2018-10-11 | Discharge: 2018-10-11 | Disposition: A | Discharge status: Home / Self Care | Payer: Medicaid Other | Attending: Emergency Medicine | Admitting: Emergency Medicine

## 2018-10-11 DIAGNOSIS — F4323 Adjustment disorder with mixed anxiety and depressed mood: Secondary | ICD-10-CM | POA: Insufficient documentation

## 2018-10-11 DIAGNOSIS — F1020 Alcohol dependence, uncomplicated: Secondary | ICD-10-CM | POA: Insufficient documentation

## 2018-10-11 DIAGNOSIS — Z79899 Other long term (current) drug therapy: Secondary | ICD-10-CM | POA: Insufficient documentation

## 2018-10-11 DIAGNOSIS — F1210 Cannabis abuse, uncomplicated: Secondary | ICD-10-CM | POA: Insufficient documentation

## 2018-10-11 DIAGNOSIS — F329 Major depressive disorder, single episode, unspecified: Secondary | ICD-10-CM | POA: Insufficient documentation

## 2018-10-11 DIAGNOSIS — L03113 Cellulitis of right upper limb: Secondary | ICD-10-CM

## 2018-10-11 DIAGNOSIS — F411 Generalized anxiety disorder: Secondary | ICD-10-CM | POA: Insufficient documentation

## 2018-10-11 DIAGNOSIS — F1110 Opioid abuse, uncomplicated: Secondary | ICD-10-CM | POA: Insufficient documentation

## 2018-10-11 DIAGNOSIS — F17210 Nicotine dependence, cigarettes, uncomplicated: Secondary | ICD-10-CM | POA: Insufficient documentation

## 2018-10-11 LAB — COMPREHENSIVE METABOLIC PANEL
ALT: 129 U/L — AB (ref 0–44)
ANION GAP: 9 (ref 5–15)
AST: 119 U/L — ABNORMAL HIGH (ref 15–41)
Albumin: 3.9 g/dL (ref 3.5–5.0)
Alkaline Phosphatase: 110 U/L (ref 38–126)
BUN: 7 mg/dL (ref 6–20)
CALCIUM: 9.3 mg/dL (ref 8.9–10.3)
CHLORIDE: 101 mmol/L (ref 98–111)
CO2: 26 mmol/L (ref 22–32)
CREATININE: 0.62 mg/dL (ref 0.44–1.00)
Glucose, Bld: 95 mg/dL (ref 70–99)
Potassium: 3.7 mmol/L (ref 3.5–5.1)
SODIUM: 136 mmol/L (ref 135–145)
Total Bilirubin: 1.2 mg/dL (ref 0.3–1.2)
Total Protein: 7.7 g/dL (ref 6.5–8.1)

## 2018-10-11 LAB — LACTIC ACID, PLASMA: LACTIC ACID, VENOUS: 0.9 mmol/L (ref 0.5–1.9)

## 2018-10-11 LAB — CBC WITH DIFFERENTIAL/PLATELET
Abs Immature Granulocytes: 0.05 10*3/uL (ref 0.00–0.07)
BASOS ABS: 0.1 10*3/uL (ref 0.0–0.1)
Basophils Relative: 0 %
EOS ABS: 0.2 10*3/uL (ref 0.0–0.5)
EOS PCT: 1 %
HCT: 40.1 % (ref 36.0–46.0)
Hemoglobin: 13.1 g/dL (ref 12.0–15.0)
IMMATURE GRANULOCYTES: 0 %
LYMPHS ABS: 2.6 10*3/uL (ref 0.7–4.0)
LYMPHS PCT: 23 %
MCH: 29.3 pg (ref 26.0–34.0)
MCHC: 32.7 g/dL (ref 30.0–36.0)
MCV: 89.7 fL (ref 80.0–100.0)
Monocytes Absolute: 1 10*3/uL (ref 0.1–1.0)
Monocytes Relative: 9 %
NEUTROS PCT: 67 %
NRBC: 0 % (ref 0.0–0.2)
Neutro Abs: 7.4 10*3/uL (ref 1.7–7.7)
Platelets: 400 10*3/uL (ref 150–400)
RBC: 4.47 MIL/uL (ref 3.87–5.11)
RDW: 13.6 % (ref 11.5–15.5)
WBC: 11.3 10*3/uL — AB (ref 4.0–10.5)

## 2018-10-11 MED ORDER — CLINDAMYCIN PHOSPHATE 600 MG/50ML IV SOLN
600.0000 mg | Freq: Once | INTRAVENOUS | Status: AC
  Administered 2018-10-11: 600 mg via INTRAVENOUS
  Filled 2018-10-11: qty 50

## 2018-10-11 MED ORDER — OXYCODONE-ACETAMINOPHEN 7.5-325 MG PO TABS: 1.0000 | ORAL_TABLET | Freq: Four times a day (QID) | ORAL | 0 refills | Status: DC | PRN

## 2018-10-11 MED ORDER — MORPHINE SULFATE (PF) 2 MG/ML IV SOLN
2.0000 mg | Freq: Once | INTRAVENOUS | Status: AC
  Administered 2018-10-11: 2 mg via INTRAVENOUS
  Filled 2018-10-11: qty 1

## 2018-10-11 MED ORDER — KETOROLAC TROMETHAMINE 30 MG/ML IJ SOLN
30.0000 mg | Freq: Once | INTRAMUSCULAR | Status: AC
  Administered 2018-10-11: 30 mg via INTRAVENOUS
  Filled 2018-10-11: qty 1

## 2018-10-11 MED ORDER — ONDANSETRON HCL 4 MG/2ML IJ SOLN
4.0000 mg | Freq: Once | INTRAMUSCULAR | Status: AC
  Administered 2018-10-11: 4 mg via INTRAVENOUS
  Filled 2018-10-11: qty 2

## 2018-10-11 MED ORDER — OXYCODONE-ACETAMINOPHEN 5-325 MG PO TABS
1.0000 | ORAL_TABLET | Freq: Once | ORAL | Status: AC
  Administered 2018-10-11: 1 via ORAL
  Filled 2018-10-11: qty 1

## 2018-10-11 MED ORDER — SODIUM CHLORIDE 0.9 % IV BOLUS
1000.0000 mL | Freq: Once | INTRAVENOUS | Status: AC
  Administered 2018-10-11: 1000 mL via INTRAVENOUS

## 2018-10-11 MED ORDER — CLINDAMYCIN HCL 300 MG PO CAPS: 300.0000 mg | ORAL_CAPSULE | Freq: Three times a day (TID) | ORAL | 0 refills | Status: AC

## 2018-10-11 NOTE — ED Notes (Signed)
See triage note  Presents with swelling to right hand  States she is an IV drug user and shot up in hand about 1 week ago  States she has had some swelling to same hand but states swelling went away during the day  Last pm and this am swelling is increased  And having more pain  Redness is localized to hand and slightly into wrist

## 2018-10-11 NOTE — ED Provider Notes (Signed)
[facility] Emergency Department Provider Note   ____________________________________________   First MD Initiated Contact with Patient 10/11/18 1332     (approximate)  I have reviewed the triage vital signs and the nursing notes.   HISTORY  Chief Complaint Hand Pain (Swelling)    HPI Dominique Frost is a 28 y.o. female patient presents with redness and swelling to the dorsal aspect of the right hand.  Patient gives a history of recent IV drug use.  Patient is right-hand dominant.  Patient rates pain as a 10/10.  Patient described the pain is "achy".  No palliative measures for complaint.  Past Medical History:  Diagnosis Date  . Borderline personality disorder (HCC)   . Depression   . Generalized anxiety disorder   . PTSD (post-traumatic stress disorder)     Patient Active Problem List   Diagnosis Date Noted  . Adjustment disorder with mixed anxiety and depressed mood 06/24/2017  . Opiate abuse, continuous (HCC) 06/24/2017  . Encounter for planned induction of labor 06/22/2017  . Pregnancy 04/25/2017  . Labor and delivery indication for care or intervention 03/24/2017  . Narcotic dependency, continuous (HCC) 03/08/2017  . SVD (spontaneous vaginal delivery) 04/21/2015  . Drug abuse, opioid type (HCC) 08/05/2014  . Alcohol addiction (HCC) 03/11/2014  . Benzodiazepine dependence (HCC) 03/11/2014  . 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 Borderline personality disorder (HCC) 10/30/2013  . Cannabis abuse, continuous 10/30/2013  . Neurosis, posttraumatic 10/30/2013  . Compulsive tobacco user syndrome 09/20/2012  . 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 DILATION AND CURETTAGE OF UTERUS      Prior to Admission medications   Medication Sig Start Date End Date Taking? Authorizing Provider  clindamycin (CLEOCIN) 300 MG capsule Take  1 capsule (300 mg total) by mouth 3 (three) times daily for 10 days. 10/11/18 10/21/18  Joni Reining, PA-C  hydrOXYzine (ATARAX/VISTARIL) 50 MG tablet Take 1 tablet (50 mg total) by mouth 2 (two) times daily as needed (anxiety). 06/25/17   Schermerhorn, Ihor Austin, MD  hydrOXYzine (VISTARIL) 50 MG capsule Take 1 capsule (50 mg total) by mouth 2 (two) times daily as needed for anxiety. 06/24/17   Clapacs, Jackquline Denmark, MD  methadone (DOLOPHINE) 10 MG/ML solution Take 60 mg by mouth 1 day or 1 dose.    [provider]  oxyCODONE-acetaminophen (PERCOCET) 7.5-325 MG tablet Take 1 tablet by mouth every 6 (six) hours as needed. 10/11/18   Joni Reining, PA-C  PARoxetine (PAXIL) 10 MG tablet Take 1 tablet (10 mg total) by mouth daily. 06/24/17 06/24/18  Clapacs, Jackquline Denmark, MD  Prenatal Multivit-Min-Fe-FA (PRENATAL VITAMINS PO) Take by mouth.    [provider]  sertraline (ZOLOFT) 100 MG tablet Take 1 1/2 tablets daily 06/23/17   [name], CNM    Allergies Clonidine and Vancomycin  Family History  Problem Relation Age of Onset  . Cancer Neg Hx   . Diabetes Neg Hx   . Heart disease Neg Hx     Social History Social History   Tobacco Use  . Smoking status: Heavy Tobacco Smoker    Packs/day: 0.75    Years: 10.00    Pack years: 7.50    Types: Cigarettes  . Smokeless tobacco: Never Used  Substance Use Topics  . Alcohol use: No  . Drug use: Yes    Types: IV    Comment: currently  takes methodone     Review of Systems  Constitutional: No fever/chills Eyes: No visual changes. ENT: No sore throat. Cardiovascular: Denies chest pain. Respiratory: Denies shortness of breath. Gastrointestinal: No abdominal pain.  No nausea, no vomiting.  No diarrhea.  No constipation. Genitourinary: Negative for dysuria. Musculoskeletal: Negative for back pain. Skin: Redness and swelling.   Neurological: Negative for headaches, focal weakness or numbness. Psychiatric: Anxiety, depression, and  PTSD.  Alcohol and drug abuse. Allergic/Immunilogical: Clonidine and vancomycin ____________________________________________   PHYSICAL EXAM:  VITAL SIGNS: ED Triage Vitals  Enc Vitals Group     BP 10/11/18 1204 114/75     Pulse Rate 10/11/18 1204 (!) 130     Resp 10/11/18 1204 18     Temp 10/11/18 1204 98.7 F (37.1 C)     Temp Source 10/11/18 1204 Oral     SpO2 10/11/18 1204 100 %     Weight 10/11/18 1206 110 lb (49.9 kg)     Height 10/11/18 1206 5\' 2"  (1.575 m)     Head Circumference --      Peak Flow --      Pain Score 10/11/18 1206 10     Pain Loc --      Pain Edu? --      Excl. in GC? --     Constitutional: Alert and oriented.  Anxious yes: Conjunctivae are normal. PERRL. EOMI. Cardiovascular: Tachycardic, regular rhythm. Grossly normal heart sounds.  Good peripheral circulation. Respiratory: Normal respiratory effort.  No retractions. Lungs CTAB. Neurologic:  Normal speech and language. No gross focal neurologic deficits are appreciated. No gait instability. Skin: Edema and erythema dorsal aspect the right hand.   Psychiatric anxious speech and behavior ____________________________________________   LABS (all labs ordered are listed, but only abnormal results are displayed)  Labs Reviewed  COMPREHENSIVE METABOLIC PANEL - Abnormal; Notable for the following components:      Result Value   AST 119 (*)    ALT 129 (*)    All other components within normal limits  CBC WITH DIFFERENTIAL/PLATELET - Abnormal; Notable for the following components:   WBC 11.3 (*)    All other components within normal limits  LACTIC ACID, PLASMA  LACTIC ACID, PLASMA  URINALYSIS, COMPLETE (UACMP) WITH MICROSCOPIC  URINE DRUG SCREEN, QUALITATIVE ([facility] ONLY)  POC URINE PREG, ED   ____________________________________________  EKG   ____________________________________________  RADIOLOGY  ED MD interpretation:    Official radiology report(s): No results  found.  ____________________________________________   PROCEDURES  Procedure(s) performed: None  Procedures  Critical Care performed: No  ____________________________________________   INITIAL IMPRESSION / ASSESSMENT AND PLAN / ED COURSE  As part of my medical decision making, I reviewed the following data within the electronic MEDICAL RECORD NUMBER    Cellulitis right hand secondary to IV drug use.  Patient started on IV antibiotics and will continue oral medication for 10 days.  Patient given discharge care instructions and advised return back if condition worsens.      ____________________________________________   FINAL CLINICAL IMPRESSION(S) / ED DIAGNOSES  Final diagnoses:  Cellulitis of right hand     ED Discharge Orders         Ordered    clindamycin (CLEOCIN) 300 MG capsule  3 times daily     10/11/18 1341    oxyCODONE-acetaminophen (PERCOCET) 7.5-325 MG tablet  Every 6 hours PRN     10/11/18 1507           Note:  This document was  prepared using Conservation officer, historic buildings and may include unintentional dictation errors.    Joni Reining, PA-C 10/11/18 1511    Phineas Semen, MD 10/11/18 2039

## 2018-10-11 NOTE — ED Triage Notes (Signed)
Pt arrived via POV with reports of recent IV drug use, has swelling and redness noted to right hand x 1 week, pt states it would get better throughout the day.

## 2018-10-11 NOTE — Discharge Instructions (Addendum)
Follow discharge care instructions and wear wrist splint for 3 to 5 days.  May remove for personal hygiene.

## 2018-10-30 ENCOUNTER — Other Ambulatory Visit: Payer: Self-pay

## 2018-10-30 ENCOUNTER — Emergency Department
Admission: EM | Admit: 2018-10-30 | Discharge: 2018-10-30 | Disposition: A | Discharge status: Home / Self Care | Payer: Medicaid Other | Attending: Emergency Medicine | Admitting: Emergency Medicine

## 2018-10-30 DIAGNOSIS — Z5321 Procedure and treatment not carried out due to patient leaving prior to being seen by health care provider: Secondary | ICD-10-CM | POA: Insufficient documentation

## 2018-10-30 DIAGNOSIS — R2231 Localized swelling, mass and lump, right upper limb: Secondary | ICD-10-CM | POA: Insufficient documentation

## 2018-10-30 NOTE — ED Notes (Signed)
No answer when called several times from lobby 

## 2018-10-30 NOTE — ED Triage Notes (Signed)
Pt with abscess to right hand states for months. STates went to [facility] and "they cut it twice". States still has area that is healing, and now co increased pain. States they left area open to let it heal from inside. Scabbed area noted, with small amt of swelling.

## 2019-03-10 LAB — HM HIV SCREENING LAB: HM HIV Screening: NEGATIVE

## 2019-04-11 ENCOUNTER — Telehealth: Payer: Self-pay | Admitting: Family Medicine

## 2019-06-11 DIAGNOSIS — Z62810 Personal history of physical and sexual abuse in childhood: Secondary | ICD-10-CM

## 2019-06-12 ENCOUNTER — Ambulatory Visit: Payer: Self-pay

## 2019-07-02 ENCOUNTER — Ambulatory Visit: Payer: Medicaid Other

## 2019-07-07 ENCOUNTER — Ambulatory Visit: Payer: Self-pay | Admitting: Physician Assistant

## 2019-07-07 ENCOUNTER — Other Ambulatory Visit: Payer: Self-pay

## 2019-07-07 DIAGNOSIS — Z113 Encounter for screening for infections with a predominantly sexual mode of transmission: Secondary | ICD-10-CM

## 2019-07-07 DIAGNOSIS — N760 Acute vaginitis: Secondary | ICD-10-CM

## 2019-07-07 DIAGNOSIS — B9689 Other specified bacterial agents as the cause of diseases classified elsewhere: Secondary | ICD-10-CM

## 2019-07-07 LAB — WET PREP FOR TRICH, YEAST, CLUE
Trichomonas Exam: NEGATIVE
Yeast Exam: NEGATIVE

## 2019-07-07 MED ORDER — METRONIDAZOLE 500 MG PO TABS: 500.0000 mg | ORAL_TABLET | Freq: Two times a day (BID) | ORAL | 0 refills | Status: AC

## 2019-07-08 ENCOUNTER — Encounter: Payer: Self-pay | Admitting: Physician Assistant
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
## 2019-08-04 ENCOUNTER — Other Ambulatory Visit: Payer: Self-pay

## 2019-08-04 DIAGNOSIS — Z20822 Contact with and (suspected) exposure to covid-19: Secondary | ICD-10-CM

## 2019-08-05 LAB — NOVEL CORONAVIRUS, NAA: SARS-CoV-2, NAA: NOT DETECTED

## 2019-08-06 ENCOUNTER — Other Ambulatory Visit: Payer: Self-pay

## 2019-08-06 ENCOUNTER — Encounter: Payer: Self-pay | Admitting: Emergency Medicine

## 2019-08-06 ENCOUNTER — Emergency Department
Admission: EM | Admit: 2019-08-06 | Discharge: 2019-08-06 | Disposition: A | Discharge status: Home / Self Care | Payer: Self-pay | Attending: Emergency Medicine | Admitting: Emergency Medicine

## 2019-08-06 DIAGNOSIS — T401X1A Poisoning by heroin, accidental (unintentional), initial encounter: Secondary | ICD-10-CM | POA: Insufficient documentation

## 2019-08-06 DIAGNOSIS — T40601A Poisoning by unspecified narcotics, accidental (unintentional), initial encounter: Secondary | ICD-10-CM

## 2019-08-06 DIAGNOSIS — F17210 Nicotine dependence, cigarettes, uncomplicated: Secondary | ICD-10-CM | POA: Insufficient documentation

## 2019-08-06 LAB — CBC WITH DIFFERENTIAL/PLATELET
Abs Immature Granulocytes: 0.05 10*3/uL (ref 0.00–0.07)
Basophils Absolute: 0.1 10*3/uL (ref 0.0–0.1)
Basophils Relative: 0 %
Eosinophils Absolute: 0 10*3/uL (ref 0.0–0.5)
Eosinophils Relative: 0 %
HCT: 43.6 % (ref 36.0–46.0)
Hemoglobin: 14.8 g/dL (ref 12.0–15.0)
Immature Granulocytes: 0 %
Lymphocytes Relative: 16 %
Lymphs Abs: 2.1 10*3/uL (ref 0.7–4.0)
MCH: 31.7 pg (ref 26.0–34.0)
MCHC: 33.9 g/dL (ref 30.0–36.0)
MCV: 93.4 fL (ref 80.0–100.0)
Monocytes Absolute: 0.6 10*3/uL (ref 0.1–1.0)
Monocytes Relative: 5 %
Neutro Abs: 10.2 10*3/uL — ABNORMAL HIGH (ref 1.7–7.7)
Neutrophils Relative %: 79 %
Platelets: 265 10*3/uL (ref 150–400)
RBC: 4.67 MIL/uL (ref 3.87–5.11)
RDW: 12.2 % (ref 11.5–15.5)
WBC: 13 10*3/uL — ABNORMAL HIGH (ref 4.0–10.5)
nRBC: 0 % (ref 0.0–0.2)

## 2019-08-06 LAB — BASIC METABOLIC PANEL
Anion gap: 10 (ref 5–15)
BUN: 5 mg/dL — ABNORMAL LOW (ref 6–20)
CO2: 24 mmol/L (ref 22–32)
Calcium: 9 mg/dL (ref 8.9–10.3)
Chloride: 109 mmol/L (ref 98–111)
Creatinine, Ser: 0.8 mg/dL (ref 0.44–1.00)
GFR calc Af Amer: >60 mL/min (ref >60)
GFR calc non Af Amer: >60 mL/min (ref >60)
Glucose, Bld: 170 mg/dL — ABNORMAL HIGH (ref 70–99)
Potassium: 3.6 mmol/L (ref 3.5–5.1)
Sodium: 143 mmol/L (ref 135–145)

## 2019-08-06 MED ORDER — NALOXONE HCL 4 MG/0.1ML NA LIQD: 1.0000 | Freq: Once | NASAL | 0 refills | Status: AC

## 2019-08-06 NOTE — ED Triage Notes (Signed)
Pt presents via acems with c/o overdose. pt snorted heroin at home, stopped breathing and pt's mom gave 8mg  narcan intranasally at home. pt states she took xanax before using heroin. Pt denies SI/HI. Pt currently alert and oriented x4. Respirations even and unlabored
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
## 2019-08-19 ENCOUNTER — Other Ambulatory Visit: Payer: Self-pay

## 2019-08-19 ENCOUNTER — Encounter: Payer: Self-pay | Admitting: Emergency Medicine

## 2019-08-19 ENCOUNTER — Emergency Department
Admission: EM | Admit: 2019-08-19 | Discharge: 2019-08-19 | Disposition: A | Discharge status: Home / Self Care | Payer: Medicaid Other | Attending: Emergency Medicine | Admitting: Emergency Medicine

## 2019-08-19 DIAGNOSIS — R531 Weakness: Secondary | ICD-10-CM | POA: Insufficient documentation

## 2019-08-19 DIAGNOSIS — Z5321 Procedure and treatment not carried out due to patient leaving prior to being seen by health care provider: Secondary | ICD-10-CM | POA: Insufficient documentation

## 2019-08-19 DIAGNOSIS — R509 Fever, unspecified: Secondary | ICD-10-CM | POA: Insufficient documentation

## 2019-08-19 NOTE — ED Triage Notes (Signed)
Patient to ER for c/o generalized body aches with fever. Patient reports mother tested positive for covid, but that has been approx one month ago. Patient reports fever as high as "100" at home. States she started feeling bad last week with general malaise.

## 2019-08-19 NOTE — ED Notes (Signed)
No answer when called several times from lobby 

## 2019-08-19 NOTE — ED Notes (Signed)
No answer when called to room

## 2019-08-19 NOTE — ED Notes (Addendum)
No answer when called several times from lobby; also attempted to call phone numbers listed in chart with no answer

## 2019-09-09 ENCOUNTER — Encounter: Payer: Self-pay | Admitting: Obstetrics and Gynecology

## 2019-09-09 NOTE — Progress Notes (Deleted)
Pt present due to refill of medication Zoloft. PHQ-9 and GAD-7 completed. PHQ-9=    GAD-7=

## 2019-10-15 ENCOUNTER — Encounter: Payer: Medicaid Other | Admitting: Obstetrics and Gynecology

## 2020-01-20 NOTE — Telephone Encounter (Signed)
Encounter closed

## 2020-11-24 ENCOUNTER — Telehealth: Payer: Self-pay | Admitting: Family Medicine

## 2020-11-24 NOTE — Telephone Encounter (Signed)
TC to client cell phone, LM with number to call. TC to home phone, which is no longer in service or has been changed.Burt Knack, RN

## 2020-11-24 NOTE — Telephone Encounter (Signed)
Patient states someone called her and now would like to speak to nurse

## 2020-11-25 NOTE — Telephone Encounter (Signed)
Phone call to pt. Left message that RN with [facility] is returning phone call, not sure if she was trying to reach [facility] or another provider. Please call us back if she needs assistance.

## 2020-11-29 ENCOUNTER — Ambulatory Visit: Payer: Medicaid Other

## 2020-12-08 ENCOUNTER — Encounter: Payer: Self-pay | Admitting: *Deleted

## 2020-12-08 ENCOUNTER — Other Ambulatory Visit: Payer: Self-pay

## 2020-12-08 ENCOUNTER — Emergency Department
Admission: EM | Admit: 2020-12-08 | Discharge: 2020-12-08 | Disposition: A | Discharge status: Home / Self Care | Payer: Medicaid Other | Attending: Emergency Medicine | Admitting: Emergency Medicine

## 2020-12-08 ENCOUNTER — Emergency Department: Payer: Medicaid Other

## 2020-12-08 DIAGNOSIS — F17210 Nicotine dependence, cigarettes, uncomplicated: Secondary | ICD-10-CM | POA: Insufficient documentation

## 2020-12-08 DIAGNOSIS — Z202 Contact with and (suspected) exposure to infections with a predominantly sexual mode of transmission: Secondary | ICD-10-CM

## 2020-12-08 DIAGNOSIS — R300 Dysuria: Secondary | ICD-10-CM | POA: Insufficient documentation

## 2020-12-08 DIAGNOSIS — N12 Tubulo-interstitial nephritis, not specified as acute or chronic: Secondary | ICD-10-CM | POA: Insufficient documentation

## 2020-12-08 DIAGNOSIS — R109 Unspecified abdominal pain: Secondary | ICD-10-CM | POA: Insufficient documentation

## 2020-12-08 LAB — CBC
HCT: 38.4 % (ref 36.0–46.0)
Hemoglobin: 13.2 g/dL (ref 12.0–15.0)
MCH: 31.1 pg (ref 26.0–34.0)
MCHC: 34.4 g/dL (ref 30.0–36.0)
MCV: 90.4 fL (ref 80.0–100.0)
Platelets: 256 10*3/uL (ref 150–400)
RBC: 4.25 MIL/uL (ref 3.87–5.11)
RDW: 12.3 % (ref 11.5–15.5)
WBC: 19.8 10*3/uL — ABNORMAL HIGH (ref 4.0–10.5)
nRBC: 0 % (ref 0.0–0.2)

## 2020-12-08 LAB — WET PREP, GENITAL
Clue Cells Wet Prep HPF POC: NONE SEEN
Sperm: NONE SEEN
Trich, Wet Prep: NONE SEEN
Yeast Wet Prep HPF POC: NONE SEEN

## 2020-12-08 LAB — HEPATIC FUNCTION PANEL
ALT: 24 U/L (ref 0–44)
AST: 21 U/L (ref 15–41)
Albumin: 3.7 g/dL (ref 3.5–5.0)
Alkaline Phosphatase: 36 U/L — ABNORMAL LOW (ref 38–126)
Bilirubin, Direct: 0.2 mg/dL (ref 0.0–0.2)
Indirect Bilirubin: 0.9 mg/dL (ref 0.3–0.9)
Total Bilirubin: 1.1 mg/dL (ref 0.3–1.2)
Total Protein: 7.3 g/dL (ref 6.5–8.1)

## 2020-12-08 LAB — URINALYSIS, COMPLETE (UACMP) WITH MICROSCOPIC
Bilirubin Urine: NEGATIVE
Glucose, UA: NEGATIVE mg/dL
Hgb urine dipstick: NEGATIVE
Ketones, ur: 5 mg/dL — AB
Nitrite: POSITIVE — AB
Protein, ur: 30 mg/dL — AB
Specific Gravity, Urine: 1.016 (ref 1.005–1.030)
WBC, UA: >50 WBC/hpf — ABNORMAL HIGH (ref 0–5)
pH: 7 (ref 5.0–8.0)

## 2020-12-08 LAB — BASIC METABOLIC PANEL
Anion gap: 8 (ref 5–15)
BUN: 6 mg/dL (ref 6–20)
CO2: 27 mmol/L (ref 22–32)
Calcium: 9.1 mg/dL (ref 8.9–10.3)
Chloride: 93 mmol/L — ABNORMAL LOW (ref 98–111)
Creatinine, Ser: 0.55 mg/dL (ref 0.44–1.00)
GFR, Estimated: >60 mL/min (ref >60)
Glucose, Bld: 108 mg/dL — ABNORMAL HIGH (ref 70–99)
Potassium: 3.6 mmol/L (ref 3.5–5.1)
Sodium: 128 mmol/L — ABNORMAL LOW (ref 135–145)

## 2020-12-08 LAB — LIPASE, BLOOD: Lipase: 24 U/L (ref 11–51)

## 2020-12-08 LAB — POC URINE PREG, ED: Preg Test, Ur: NEGATIVE

## 2020-12-08 MED ORDER — CEPHALEXIN 500 MG PO CAPS: 500.0000 mg | ORAL_CAPSULE | Freq: Four times a day (QID) | ORAL | 0 refills | Status: DC

## 2020-12-08 MED ORDER — ONDANSETRON HCL 4 MG/2ML IJ SOLN
4.0000 mg | Freq: Once | INTRAMUSCULAR | Status: AC
  Administered 2020-12-08: 4 mg via INTRAVENOUS
  Filled 2020-12-08: qty 2

## 2020-12-08 MED ORDER — SODIUM CHLORIDE 0.9 % IV SOLN
1.0000 g | Freq: Once | INTRAVENOUS | Status: AC
  Administered 2020-12-08: 1 g via INTRAVENOUS
  Filled 2020-12-08: qty 10

## 2020-12-08 MED ORDER — LACTATED RINGERS IV BOLUS
1000.0000 mL | Freq: Once | INTRAVENOUS | Status: AC
  Administered 2020-12-08: 1000 mL via INTRAVENOUS

## 2020-12-08 MED ORDER — KETOROLAC TROMETHAMINE 30 MG/ML IJ SOLN
30.0000 mg | Freq: Once | INTRAMUSCULAR | Status: AC
  Administered 2020-12-08: 30 mg via INTRAVENOUS
  Filled 2020-12-08: qty 1
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
## 2020-12-08 NOTE — ED Notes (Signed)
POC Preg performed, negative.

## 2020-12-08 NOTE — ED Triage Notes (Signed)
Pt has left flank pain.  Pt has dysuria.  Sx began yesterday.  Pt also has nausea, chills.  Pt alert speech clear.

## 2020-12-09 ENCOUNTER — Telehealth: Payer: Self-pay | Admitting: Emergency Medicine

## 2020-12-09 LAB — CHLAMYDIA/NGC RT PCR (ARMC ONLY)
Chlamydia Tr: NOT DETECTED
N gonorrhoeae: DETECTED — AB

## 2020-12-09 NOTE — Telephone Encounter (Signed)
Called patient to notify her of positive gonorrhea result.  She was treated with ceftriaxone during visit.  Instructed to inform partner to call [facility] today to get treated.  She agrees.

## 2021-06-03 IMAGING — CT CT RENAL STONE PROTOCOL
3 of 4 series · 7 of 46 positions shown, 13 images · non-contrast
Comparison: Pelvic ultrasound 07/05/2017

CLINICAL DATA: Left flank pain, dysuria, nausea, chills

EXAM:
CT ABDOMEN AND PELVIS WITHOUT CONTRAST
TECHNIQUE: Multidetector CT imaging of the abdomen and pelvis was performed
following the standard protocol without IV contrast.

[Series 4: lung bases · axial · 0.71mm/px · z∈[-983,-938]mm · 3 of 19 slices shown, 7 images]
[im 5/19  soft-tissue]
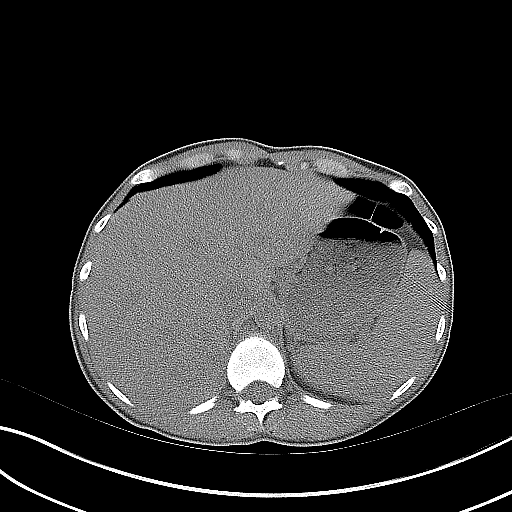
[im 5/19  lung]
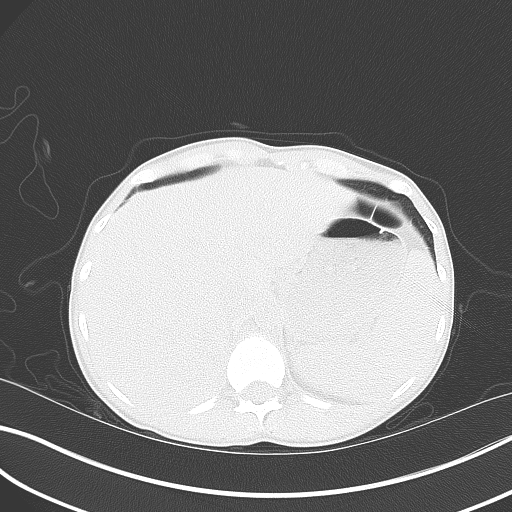
[im 5/19  bone]
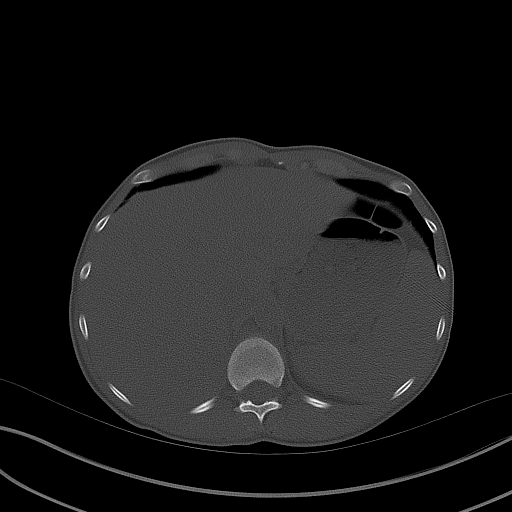
[im 10/19  soft-tissue]
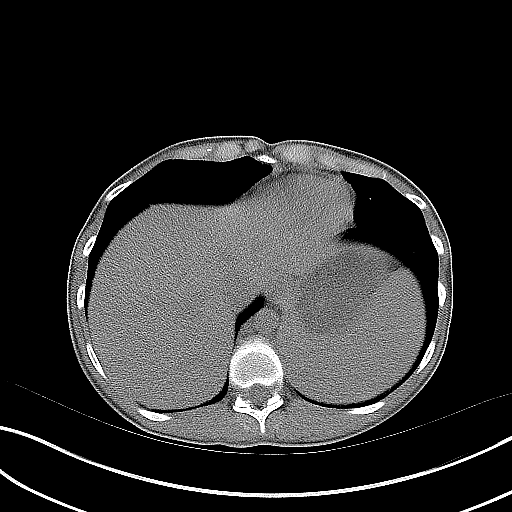
[im 10/19  lung]
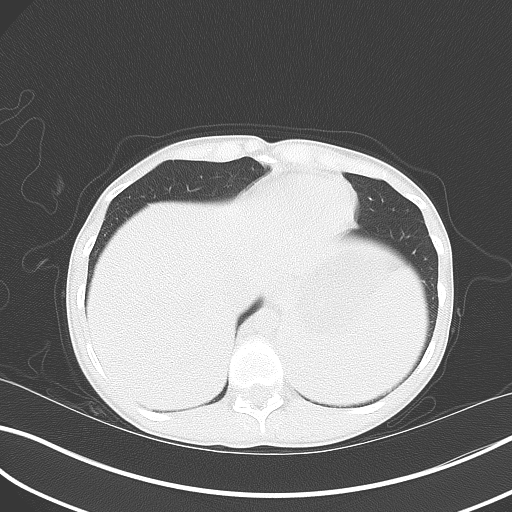
[im 14/19  soft-tissue]
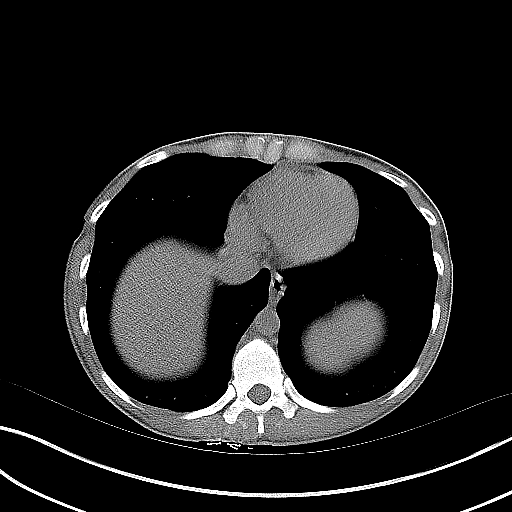
[im 14/19  lung]
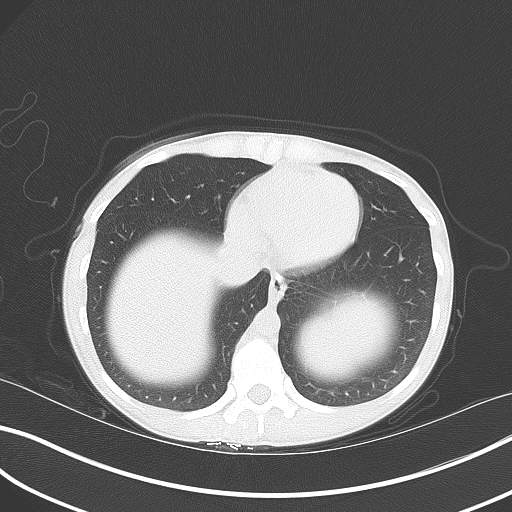

[Series 5: coronal · coronal · 0.75mm/px · 3 of 120 slices shown, 4 images]
[im 40/120  soft-tissue]
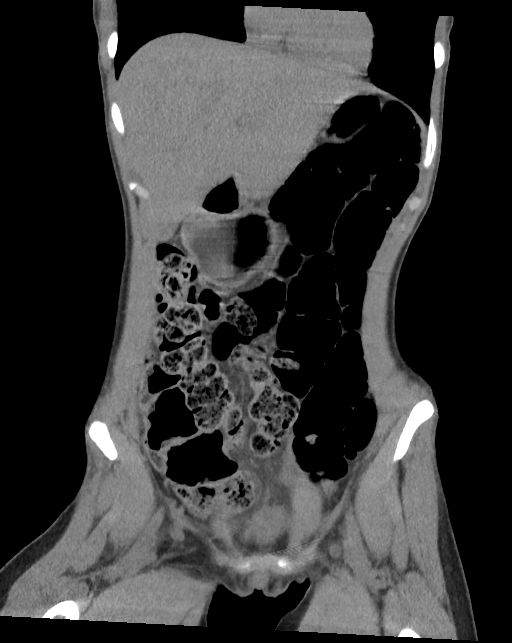
[im 53/120  soft-tissue]
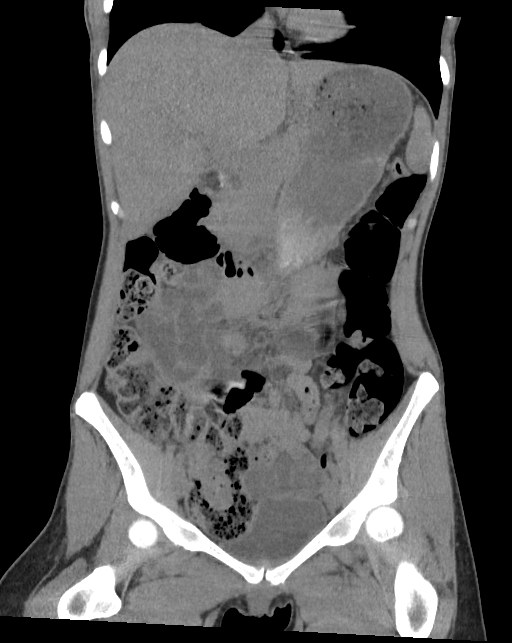
[im 53/120  bone]
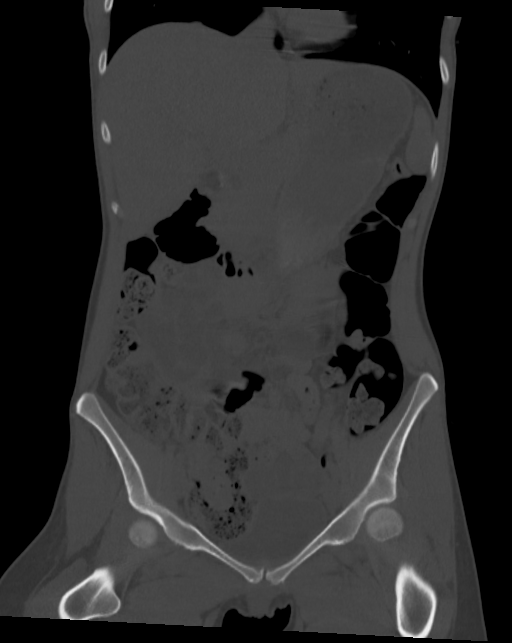
[im 67/120  soft-tissue]
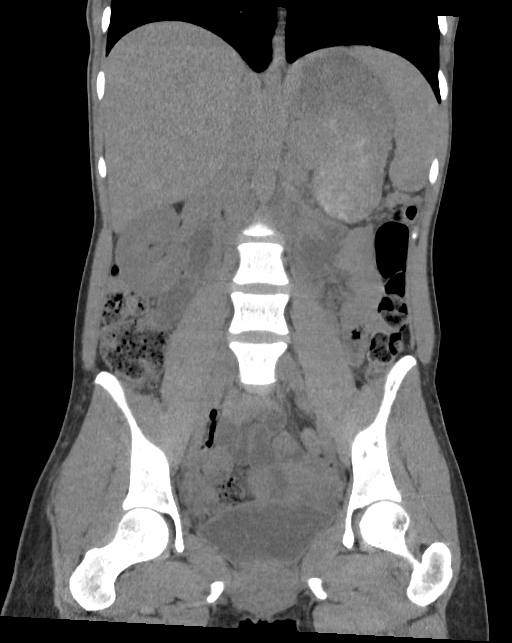

[Series 6: sagittal · sagittal · 0.47mm/px · 1 of 160 slices shown, 2 images]
[im 54/160  soft-tissue]
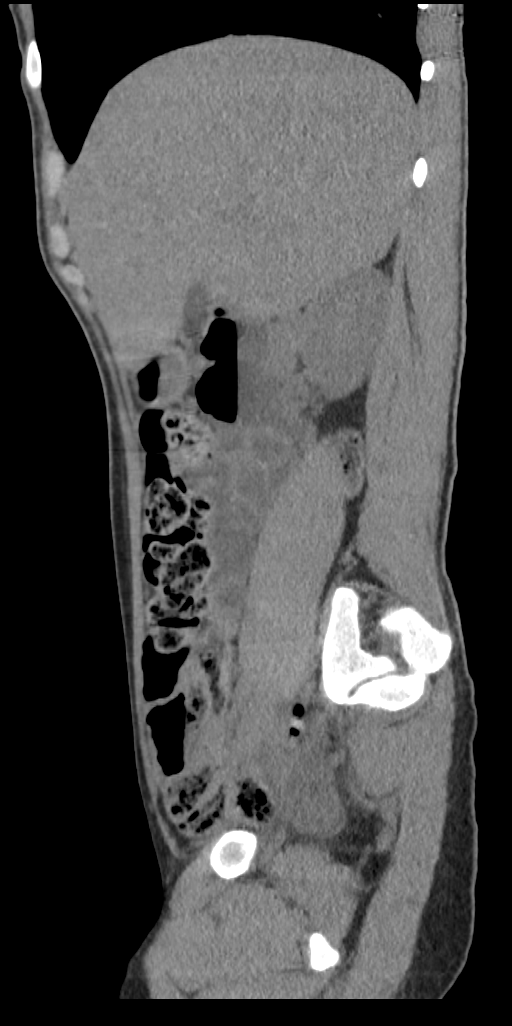
[im 54/160  bone]
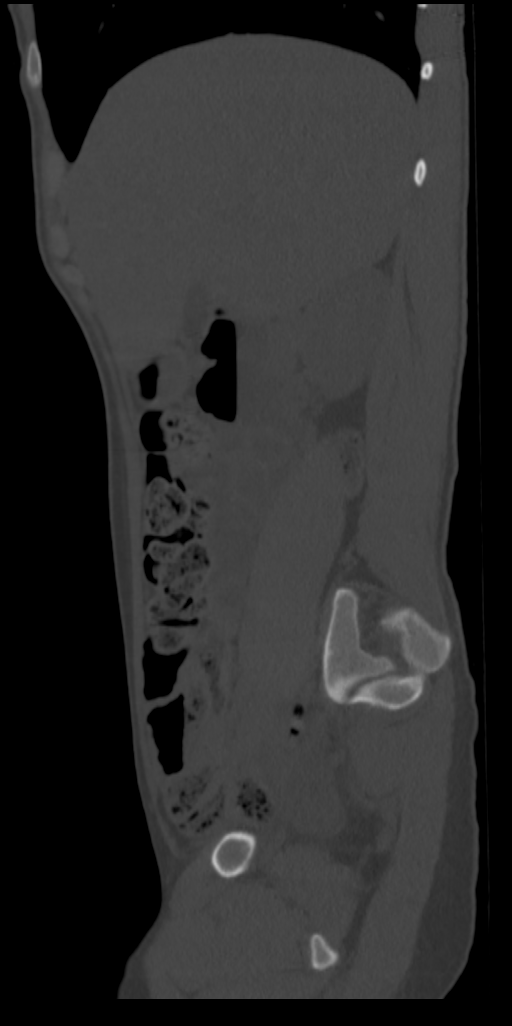

[7 of 46 positions shown; findings below may reference images not displayed]

FINDINGS: Lower chest: Some patchy ground-glass and consolidation seen in the
lingula ([DATE]). No other focal consolidation or opacity. No effusion.
Normal heart size. No pericardial effusion.

Hepatobiliary: No visible focal liver lesion with limitations of
this unenhanced CT. Question some mild gallbladder wall thickening
though may be related to underdistention. The common bile duct is
borderline dilated as well at 8 mm. No visible calcified gallstones.

Pancreas: No pancreatic ductal dilatation or surrounding
inflammatory changes.

Spleen: Normal in size. No concerning splenic lesions.

Adrenals/Urinary Tract: Normal adrenal glands. Asymmetric left renal
fullness with extensive left perinephric stranding and inflammatory
changes in the Peri renal and pararenal spaces. No frank
hydronephrosis is seen however. No visible urolithiasis. No right
urinary tract dilatation or significant perinephric stranding.
Urinary bladder is fairly unremarkable for degree of distension.

Stomach/Bowel: Distal esophagus, stomach and duodenum are
unremarkable, normal duodenal sweep across the midline abdomen. No
small bowel thickening or dilatation. No colonic dilatation or wall
thickening. A normal appendix is visualized.

Vascular/Lymphatic: No significant vascular findings are present. No
enlarged abdominal or pelvic lymph nodes.

Reproductive: Retroverted uterus.

Other: No abdominopelvic free fluid or free gas. Perinephric hazy
stranding and retroperitoneal inflammation, as above.No bowel
containing hernias.

Musculoskeletal: No acute osseous abnormality or suspicious osseous
lesion.
IMPRESSION: 1. Asymmetric left renal fullness with stranding in the peri- and
pararenal spaces. No frank hydronephrosis is seen however. No
visible urolithiasis. Findings could reflect a recently passed stone
or ascending urinary tract infection. Correlate with urinalysis.
2. Some patchy ground-glass and consolidation in the lingula, could
reflect an acute infectious or inflammatory process versus post
inflammatory sequela.
3. Gallbladder wall thickening likely related underdistention
however there is some borderline common bile duct dilatation to 8
mm. No visible calcified gallstones. Correlate with serologies and
if there is clinical concern for biliary obstruction, consider
further evaluation with right upper quadrant ultrasound.

## 2022-03-21 ENCOUNTER — Encounter: Payer: Self-pay | Admitting: Emergency Medicine

## 2022-03-21 ENCOUNTER — Emergency Department
Admission: EM | Admit: 2022-03-21 | Discharge: 2022-03-21 | Disposition: A | Discharge status: Home / Self Care | Payer: Medicaid Other | Attending: Emergency Medicine | Admitting: Emergency Medicine

## 2022-03-21 DIAGNOSIS — K0889 Other specified disorders of teeth and supporting structures: Secondary | ICD-10-CM | POA: Insufficient documentation

## 2022-03-21 MED ORDER — PENICILLIN V POTASSIUM 250 MG PO TABS: 250.0000 mg | ORAL_TABLET | Freq: Four times a day (QID) | ORAL | 0 refills | Status: DC

## 2022-03-21 NOTE — ED Triage Notes (Signed)
Pt endorses right sided jaw pain and dental pain that she noticed this morning.

## 2022-03-21 NOTE — ED Provider Notes (Signed)
   [facility] Provider Note    Event Date/Time   First MD Initiated Contact with Patient 03/21/22 1401     (approximate)   History   Dental Pain   HPI  Dominique Frost is a 31 y.o. female who presents with complaints of right posterior lower dental pain, cannot see her dentist for another week     Physical Exam   Triage Vital Signs: ED Triage Vitals [03/21/22 1400]  Enc Vitals Group     BP (!) 143/89     Pulse Rate 77     Resp 16     Temp 98.7 F (37.1 C)     Temp src      SpO2 96 %     Weight      Height      Head Circumference      Peak Flow      Pain Score 10     Pain Loc      Pain Edu?      Excl. in GC?     Most recent vital signs: Vitals:   03/21/22 1400  BP: (!) 143/89  Pulse: 77  Resp: 16  Temp: 98.7 F (37.1 C)  SpO2: 96%     General: Awake, no distress.  CV:  Good peripheral perfusion.  Resp:  Normal effort.  Abd:  No distention.  Other:  Pharynx: Normal exam, poor dentition, no evidence of dental abscess   ED Results / Procedures / Treatments   Labs (all labs ordered are listed, but only abnormal results are displayed) Labs Reviewed - No data to display   EKG     RADIOLOGY     PROCEDURES:  Critical Care performed:   Procedures   MEDICATIONS ORDERED IN ED: Medications - No data to display   IMPRESSION / MDM / ASSESSMENT AND PLAN / ED COURSE  I reviewed the triage vital signs and the nursing notes. Patient's presentation is most consistent with acute, uncomplicated illness.  Exam is consistent with dental infection, will start the patient on antibiotics, outpatient follow-up with dentist        FINAL CLINICAL IMPRESSION(S) / ED DIAGNOSES   Final diagnoses:  Pain, dental     Rx / DC Orders   ED Discharge Orders          Ordered    penicillin v potassium (VEETID) 250 MG tablet  4 times daily        03/21/22 1401             Note:  This document was prepared using  Dragon voice recognition software and may include unintentional dictation errors.   Jene Every, MD 03/21/22 1535

## 2022-04-06 ENCOUNTER — Ambulatory Visit (LOCAL_COMMUNITY_HEALTH_CENTER): Payer: Medicaid Other | Admitting: Physician Assistant

## 2022-04-06 VITALS — BP 99/59 | HR 76 | Temp 97.1°F | Resp 16 | Ht 63.0 in | Wt 128.0 lb

## 2022-04-06 DIAGNOSIS — Z3044 Encounter for surveillance of vaginal ring hormonal contraceptive device: Secondary | ICD-10-CM | POA: Diagnosis not present

## 2022-04-06 DIAGNOSIS — Z3009 Encounter for other general counseling and advice on contraception: Secondary | ICD-10-CM

## 2022-04-06 DIAGNOSIS — Z01419 Encounter for gynecological examination (general) (routine) without abnormal findings: Secondary | ICD-10-CM | POA: Diagnosis not present

## 2022-04-06 DIAGNOSIS — F1120 Opioid dependence, uncomplicated: Secondary | ICD-10-CM

## 2022-04-06 DIAGNOSIS — B1920 Unspecified viral hepatitis C without hepatic coma: Secondary | ICD-10-CM

## 2022-04-06 DIAGNOSIS — F1110 Opioid abuse, uncomplicated: Secondary | ICD-10-CM

## 2022-04-06 MED ORDER — ETONOGESTREL-ETHINYL ESTRADIOL 0.12-0.015 MG/24HR VA RING: VAGINAL_RING | VAGINAL | 12 refills | Status: DC
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
## 2022-04-11 ENCOUNTER — Telehealth: Payer: Self-pay

## 2022-04-11 NOTE — Telephone Encounter (Signed)
Call to [facility] to ascertain if appt scheduled (referral re: Hepatitis C treatment faxed 04/07/2022). Per Latoya, no appt scheduled. Call to client to provide her agency phone number so she can schedule appt. Voice message is in [place] and Marlene [place] interpreted message => voicemail is full per recorded message. Call to client to be attempted another day. Jossie Ng, RN

## 2022-04-12 NOTE — Telephone Encounter (Signed)
Call to client and phone call will not connect. Contact to emergency contact (mother) and per recorded message, number is not in service. Jossie Ng, RN

## 2022-07-27 ENCOUNTER — Emergency Department
Admission: EM | Admit: 2022-07-27 | Discharge: 2022-07-27 | Disposition: A | Discharge status: Home / Self Care | Payer: Medicaid Other | Attending: Emergency Medicine | Admitting: Emergency Medicine

## 2022-07-27 ENCOUNTER — Other Ambulatory Visit: Payer: Self-pay

## 2022-07-27 DIAGNOSIS — T50901A Poisoning by unspecified drugs, medicaments and biological substances, accidental (unintentional), initial encounter: Secondary | ICD-10-CM | POA: Insufficient documentation

## 2022-07-27 DIAGNOSIS — X58XXXA Exposure to other specified factors, initial encounter: Secondary | ICD-10-CM | POA: Insufficient documentation

## 2022-07-27 LAB — COMPREHENSIVE METABOLIC PANEL
ALT: 58 U/L — ABNORMAL HIGH (ref 0–44)
AST: 47 U/L — ABNORMAL HIGH (ref 15–41)
Albumin: 4.4 g/dL (ref 3.5–5.0)
Alkaline Phosphatase: 44 U/L (ref 38–126)
Anion gap: 10 (ref 5–15)
BUN: 10 mg/dL (ref 6–20)
CO2: 25 mmol/L (ref 22–32)
Calcium: 9.3 mg/dL (ref 8.9–10.3)
Chloride: 104 mmol/L (ref 98–111)
Creatinine, Ser: 0.82 mg/dL (ref 0.44–1.00)
GFR, Estimated: >60 mL/min (ref >60)
Glucose, Bld: 112 mg/dL — ABNORMAL HIGH (ref 70–99)
Potassium: 3.8 mmol/L (ref 3.5–5.1)
Sodium: 139 mmol/L (ref 135–145)
Total Bilirubin: 0.6 mg/dL (ref 0.3–1.2)
Total Protein: 8.4 g/dL — ABNORMAL HIGH (ref 6.5–8.1)

## 2022-07-27 LAB — URINE DRUG SCREEN, QUALITATIVE (ARMC ONLY)
Amphetamines, Ur Screen: POSITIVE — AB
Barbiturates, Ur Screen: NOT DETECTED
Benzodiazepine, Ur Scrn: POSITIVE — AB
Cannabinoid 50 Ng, Ur ~~LOC~~: POSITIVE — AB
Cocaine Metabolite,Ur ~~LOC~~: NOT DETECTED
MDMA (Ecstasy)Ur Screen: NOT DETECTED
Methadone Scn, Ur: NOT DETECTED
Opiate, Ur Screen: NOT DETECTED
Phencyclidine (PCP) Ur S: NOT DETECTED
Tricyclic, Ur Screen: NOT DETECTED

## 2022-07-27 LAB — SALICYLATE LEVEL: Salicylate Lvl: <7 mg/dL — ABNORMAL LOW (ref 7.0–30.0)

## 2022-07-27 LAB — CBC
HCT: 42.2 % (ref 36.0–46.0)
Hemoglobin: 14.3 g/dL (ref 12.0–15.0)
MCH: 30.4 pg (ref 26.0–34.0)
MCHC: 33.9 g/dL (ref 30.0–36.0)
MCV: 89.8 fL (ref 80.0–100.0)
Platelets: 328 10*3/uL (ref 150–400)
RBC: 4.7 MIL/uL (ref 3.87–5.11)
RDW: 12.1 % (ref 11.5–15.5)
WBC: 8.1 10*3/uL (ref 4.0–10.5)
nRBC: 0 % (ref 0.0–0.2)

## 2022-07-27 LAB — HCG, QUANTITATIVE, PREGNANCY: hCG, Beta Chain, Quant, S: <1 m[IU]/mL (ref <5)

## 2022-07-27 LAB — PREGNANCY, URINE: Preg Test, Ur: NEGATIVE

## 2022-07-27 LAB — ACETAMINOPHEN LEVEL: Acetaminophen (Tylenol), Serum: <10 ug/mL — ABNORMAL LOW (ref 10–30)

## 2022-07-27 LAB — ETHANOL: Alcohol, Ethyl (B): <10 mg/dL (ref <10)

## 2022-07-27 MED ORDER — LORAZEPAM 2 MG/ML IJ SOLN
INTRAMUSCULAR | Status: AC
  Filled 2022-07-27: qty 1

## 2022-07-27 MED ORDER — ONDANSETRON HCL 4 MG/2ML IJ SOLN
INTRAMUSCULAR | Status: AC
  Administered 2022-07-27: 4 mg via INTRAVENOUS
  Filled 2022-07-27: qty 2

## 2022-07-27 MED ORDER — LORAZEPAM 2 MG/ML IJ SOLN
INTRAMUSCULAR | Status: AC
  Administered 2022-07-27: 2 mg via INTRAVENOUS
  Filled 2022-07-27: qty 1

## 2022-07-27 MED ORDER — LORAZEPAM 2 MG/ML IJ SOLN
2.0000 mg | Freq: Once | INTRAMUSCULAR | Status: AC
  Administered 2022-07-27: 2 mg via INTRAVENOUS

## 2022-07-27 MED ORDER — LORAZEPAM 2 MG/ML IJ SOLN: 2.0000 mg | Freq: Once | INTRAMUSCULAR | Status: AC

## 2022-07-27 MED ORDER — ONDANSETRON HCL 4 MG/2ML IJ SOLN: 4.0000 mg | Freq: Once | INTRAMUSCULAR | Status: AC

## 2022-07-27 NOTE — ED Triage Notes (Signed)
Pt here stating she took meth at 0400 after not taking drugs for 8 months. Pt behaving erratic on arrival to ED. Pt states she is also on Suboxone.

## 2022-07-27 NOTE — ED Notes (Signed)
EDP at bedside  

## 2022-07-27 NOTE — ED Notes (Signed)
Patients keys and $10 bill placed in labeled bag at patient bedside.

## 2022-07-27 NOTE — Discharge Instructions (Signed)
Please be very careful.  Try not to use any more illicit drugs.  If you are not going to get a prescription filled I would not

## 2022-07-27 NOTE — ED Notes (Addendum)
Lost IV access at this time
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
## 2022-07-27 NOTE — ED Notes (Addendum)
Pt given phone to speak with mother - security unable to retrieve pt's phone out of car

## 2022-07-27 NOTE — ED Notes (Signed)
Pt unhooked to go to bathroom.  

## 2022-08-05 ENCOUNTER — Emergency Department
Admission: EM | Admit: 2022-08-05 | Discharge: 2022-08-05 | Disposition: A | Discharge status: Home / Self Care | Payer: Self-pay | Attending: Emergency Medicine | Admitting: Emergency Medicine

## 2022-08-05 ENCOUNTER — Other Ambulatory Visit: Payer: Self-pay

## 2022-08-05 ENCOUNTER — Encounter: Payer: Self-pay | Admitting: Emergency Medicine

## 2022-08-05 DIAGNOSIS — N760 Acute vaginitis: Secondary | ICD-10-CM | POA: Insufficient documentation

## 2022-08-05 DIAGNOSIS — B9689 Other specified bacterial agents as the cause of diseases classified elsewhere: Secondary | ICD-10-CM

## 2022-08-05 LAB — WET PREP, GENITAL
Sperm: NONE SEEN
Trich, Wet Prep: NONE SEEN
WBC, Wet Prep HPF POC: >=10 — AB (ref <10)
Yeast Wet Prep HPF POC: NONE SEEN

## 2022-08-05 LAB — URINALYSIS, ROUTINE W REFLEX MICROSCOPIC
Bilirubin Urine: NEGATIVE
Glucose, UA: NEGATIVE mg/dL
Hgb urine dipstick: NEGATIVE
Ketones, ur: NEGATIVE mg/dL
Nitrite: NEGATIVE
Protein, ur: NEGATIVE mg/dL
Specific Gravity, Urine: 1.01 (ref 1.005–1.030)
pH: 6 (ref 5.0–8.0)

## 2022-08-05 LAB — CHLAMYDIA/NGC RT PCR (ARMC ONLY)
Chlamydia Tr: NOT DETECTED
N gonorrhoeae: NOT DETECTED

## 2022-08-05 LAB — POC URINE PREG, ED: Preg Test, Ur: NEGATIVE

## 2022-08-05 MED ORDER — METRONIDAZOLE 500 MG PO TABS: 500.0000 mg | ORAL_TABLET | Freq: Two times a day (BID) | ORAL | 0 refills | Status: DC

## 2022-08-05 NOTE — Discharge Instructions (Addendum)
A prescription for Flagyl was sent to the pharmacy to begin taking today and for the next 7 days until completely finished.  The results of your gonorrhea and Chlamydia test can be seen on MyChart.  If they are positive you can be treated at the health department for free and their contact information is listed on your discharge papers.

## 2022-08-05 NOTE — ED Notes (Signed)
62 yof with a c/c of smelly vaginal discharge for the past week. The pt advised she recently had a new sex partner and had unprotected sex within the last two weeks. Pt also admits to recent meth use.

## 2022-08-05 NOTE — ED Triage Notes (Signed)
Pt via POV from home. Pt c/o vaginal discharge and foul smelling discharge. States she did have sex with someone new recently. Denies any urinary symptoms. Pt is A&Ox4 and NAD

## 2022-08-05 NOTE — ED Provider Notes (Signed)
[facility] Provider Note    Event Date/Time   First MD Initiated Contact with Patient 08/05/22 [phone]     (approximate)   History   SEXUALLY TRANSMITTED DISEASE   HPI  Dominique Frost is a 31 y.o. female   presents to the ED with complaint of vaginal discharge and foul smelling odor that recently started.  Patient states that she recently had unprotected sex.  She denies any urinary symptoms, fever, chills, nausea or vomiting.  Patient has a history of nonpsychotic mental disorder, adjustment disorder and polysubstance abuse.      Physical Exam   Triage Vital Signs: ED Triage Vitals  Enc Vitals Group     BP 08/05/22 0819 (!) 147/89     Pulse Rate 08/05/22 0819 (!) 108     Resp 08/05/22 0819 16     Temp 08/05/22 0819 97.9 F (36.6 C)     Temp Source 08/05/22 0819 Oral     SpO2 08/05/22 0819 100 %     Weight 08/05/22 0817 130 lb (59 kg)     Height 08/05/22 0817 5\' 3"  (1.6 m)     Head Circumference --      Peak Flow --      Pain Score 08/05/22 0817 0     Pain Loc --      Pain Edu? --      Excl. in [place]? --     Most recent vital signs: Vitals:   08/05/22 0819  BP: (!) 147/89  Pulse: (!) 108  Resp: 16  Temp: 97.9 F (36.6 C)  SpO2: 100%     General: Awake, no distress.  CV:  Good peripheral perfusion.  Resp:  Normal effort.  Abd:  No distention.  Other:     ED Results / Procedures / Treatments   Labs (all labs ordered are listed, but only abnormal results are displayed) Labs Reviewed  WET PREP, GENITAL - Abnormal; Notable for the following components:      Result Value   Clue Cells Wet Prep HPF POC PRESENT (*)    WBC, Wet Prep HPF POC >=10 (*)    All other components within normal limits  URINALYSIS, ROUTINE W REFLEX MICROSCOPIC - Abnormal; Notable for the following components:   Color, Urine YELLOW (*)    APPearance CLOUDY (*)    Leukocytes,Ua TRACE (*)    Bacteria, UA RARE (*)    All other components within normal limits   CHLAMYDIA/NGC RT PCR ([facility] ONLY)            POC URINE PREG, ED      PROCEDURES:  Critical Care performed:   Procedures   MEDICATIONS ORDERED IN ED: Medications - No data to display   IMPRESSION / MDM / [place] / ED COURSE  I reviewed the triage vital signs and the nursing notes.   Differential diagnosis includes, but is not limited to, urinary tract infection, vaginal infection, STD exposure.  31 year old female presents to the ED with complaint of vaginal discharge after having unprotected sex.  Wet prep does show clue cells and patient was made aware that most likely this is bacterial vaginosis which she is familiar with and is taking medication in the past.  Patient will look on MyChart for results of chlamydia and gonorrhea test which will be resulted later today.  We discussed treatment that is free at the health department should her test come back positive.  Prescription for Flagyl was sent  to the pharmacy and patient is aware that she cannot drink alcohol while taking this medication.      Patient's presentation is most consistent with acute complicated illness / injury requiring diagnostic workup.  FINAL CLINICAL IMPRESSION(S) / ED DIAGNOSES   Final diagnoses:  BV (bacterial vaginosis)     Rx / DC Orders   ED Discharge Orders          Ordered    metroNIDAZOLE (FLAGYL) 500 MG tablet  2 times daily        08/05/22 0941             Note:  This document was prepared using Dragon voice recognition software and may include unintentional dictation errors.   Tommi Rumps, PA-C 08/05/22 1130    Chesley Noon, MD 08/05/22 1451

## 2022-11-13 ENCOUNTER — Encounter: Payer: Self-pay | Admitting: Emergency Medicine

## 2022-11-13 ENCOUNTER — Other Ambulatory Visit: Payer: Self-pay

## 2022-11-13 ENCOUNTER — Emergency Department
Admission: EM | Admit: 2022-11-13 | Discharge: 2022-11-13 | Disposition: A | Discharge status: Home / Self Care | Payer: Medicaid Other | Attending: Emergency Medicine | Admitting: Emergency Medicine

## 2022-11-13 DIAGNOSIS — K029 Dental caries, unspecified: Secondary | ICD-10-CM | POA: Diagnosis not present

## 2022-11-13 DIAGNOSIS — K0889 Other specified disorders of teeth and supporting structures: Secondary | ICD-10-CM

## 2022-11-13 MED ORDER — AMOXICILLIN 500 MG PO CAPS
500.0000 mg | ORAL_CAPSULE | Freq: Once | ORAL | Status: AC
  Administered 2022-11-13: 500 mg via ORAL
  Filled 2022-11-13: qty 1

## 2022-11-13 MED ORDER — AMOXICILLIN 500 MG PO TABS: 500.0000 mg | ORAL_TABLET | Freq: Three times a day (TID) | ORAL | 0 refills | Status: AC

## 2022-11-13 NOTE — ED Triage Notes (Signed)
Pt to ED for upper left dental pain started a few days ago.
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
## 2022-11-13 NOTE — Discharge Instructions (Addendum)
Take the antibiotic 3 times a day for the next 7 days.  It is very important you follow-up with a dentist.

## 2022-12-21 ENCOUNTER — Encounter: Payer: Self-pay | Admitting: Internal Medicine

## 2023-04-10 ENCOUNTER — Ambulatory Visit (INDEPENDENT_AMBULATORY_CARE_PROVIDER_SITE_OTHER): Payer: MEDICAID | Admitting: Gastroenterology

## 2023-04-10 ENCOUNTER — Encounter: Payer: Self-pay | Admitting: Gastroenterology

## 2023-04-10 VITALS — BP 109/72 | HR 79 | Temp 98.4°F | Ht 63.0 in | Wt 150.0 lb

## 2023-04-10 DIAGNOSIS — B182 Chronic viral hepatitis C: Secondary | ICD-10-CM | POA: Diagnosis not present

## 2023-04-10 NOTE — Progress Notes (Signed)
Gastroenterology Consultation  Referring Provider:     Sherrie Mustache, MD Primary Care Physician:  Patient, No Pcp Per Primary Gastroenterologist:  Dr. Servando Snare     Reason for Consultation:     Hepatitis C        HPI:   Dominique Frost is a 32 y.o. y/o female referred for consultation & management of hepatitis C by Dr. Patient, No Pcp Per.  This patient comes in today after being sent by her primary care provider after being found to have hepatitis C antibody positive and a viral load showing 7,500,000 international units/mL with a hepatitis A total antibody being negative and a hepatitis B surface antibody being positive. The patient reports that she has 4 children but did not start using IV drugs until after her last child was born.  She has been clean for 2 years.  She states that she is known about her hepatitis C positivity for the last few years.  She has not been treated in the past for it.  She states that she has not had her significant other tested yet for hepatitis C.  Past Medical History:  Diagnosis Date   Borderline personality disorder (HCC)    Depression    Generalized anxiety disorder    PTSD (post-traumatic stress disorder)     Past Surgical History:  Procedure Laterality Date   DILATION AND CURETTAGE OF UTERUS      Prior to Admission medications   Medication Sig Start Date End Date Taking? Authorizing Provider  buprenorphine-naloxone (SUBOXONE) 8-2 mg SUBL SL tablet Place 1 tablet under the tongue 2 (two) times daily. 04/03/22   [provider]  busPIRone (BUSPAR) 10 MG tablet Take 10 mg by mouth 3 (three) times daily. 05/01/22   [provider]  etonogestrel-ethinyl estradiol (NUVARING) 0.12-0.015 MG/24HR vaginal ring Insert vaginally and leave in place for 3 consecutive weeks, then remove for 1 week. 04/06/22   Streilein, Annamarie, PA-C  methadone (DOLOPHINE) 10 MG/ML solution Take 60 mg by mouth 1 day or 1 dose. Patient not taking: Reported on  04/06/2022    [provider]  metroNIDAZOLE (FLAGYL) 500 MG tablet Take 1 tablet (500 mg total) by mouth 2 (two) times daily. 08/05/22   Tommi Rumps, PA-C  OLANZapine (ZYPREXA) 10 MG tablet Take 10 mg by mouth at bedtime. 04/03/22   [provider]  PARoxetine (PAXIL) 10 MG tablet Take 1 tablet (10 mg total) by mouth daily. 06/24/17 06/24/18  Clapacs, Jackquline Denmark, MD    Family History  Problem Relation Age of Onset   Stroke Paternal Grandfather    Emphysema Maternal Grandmother    Anxiety disorder Maternal Grandmother    Depression Maternal Grandmother    Skin cancer Maternal Grandmother    Hypertension Father    Clotting disorder Father        blood clot   Skin cancer Mother    Anemia Mother    Migraines Mother    Depression Mother    Healthy Brother    Migraines Sister    Cancer Neg Hx    Diabetes Neg Hx    Heart disease Neg Hx      Social History   Tobacco Use   Smoking status: Former    Packs/day: 0.75    Years: 10.00    Additional pack years: 0.00    Total pack years: 7.50    Types: Cigarettes    Quit date: 11/2021    Years since quitting: 1.4  Passive exposure: Never   Smokeless tobacco: Never  Vaping Use   Vaping Use: Every day   Substances: Flavoring  Substance Use Topics   Alcohol use: No   Drug use: Yes    Types: IV    Comment: currently takes Rx suboxone    Allergies as of 04/10/2023 - Review Complete 11/13/2022  Allergen Reaction Noted   Clonidine Rash 04/04/2015   Vancomycin Rash 04/04/2015    Review of Systems:    All systems reviewed and negative except where noted in HPI.   Physical Exam:  There were no vitals taken for this visit. No LMP recorded. General:   Alert,  Well-developed, well-nourished, pleasant and cooperative in NAD Head:  Normocephalic and atraumatic. Eyes:  Sclera clear, no icterus.   Conjunctiva pink. Ears:  Normal auditory acuity. Neck:  Supple; no masses or thyromegaly. Lungs:  Respirations even and  unlabored.  Clear throughout to auscultation.   No wheezes, crackles, or rhonchi. No acute distress. Heart:  Regular rate and rhythm; no murmurs, clicks, rubs, or gallops. Abdomen:  Normal bowel sounds.  No bruits.  Soft, non-tender and non-distended without masses, hepatosplenomegaly or hernias noted.  No guarding or rebound tenderness.  Negative Carnett sign.   Rectal:  Deferred.  Pulses:  Normal pulses noted. Extremities:  No clubbing or edema.  No cyanosis. Neurologic:  Alert and oriented x3;  grossly normal neurologically. Skin:  Intact without significant lesions or rashes.  No jaundice. Lymph Nodes:  No significant cervical adenopathy. Psych:  Alert and cooperative. Normal mood and affect.  Imaging Studies: No results found.  Assessment and Plan:   Dominique Frost is a 32 y.o. y/o female who comes in today with a findings of hepatitis C virus positive with a viral load of 7,500,000 international units/mL.  The patient will have her blood sent off for assessing fibrosis and will also have a genotype sent off.  Based on these results we we will start her medication for treatment of her hepatitis C.  The patient has been explained the plan and agrees with it.    Midge Minium, MD. Clementeen Graham    Note: This dictation was prepared with Dragon dictation along with smaller phrase technology. Any transcriptional errors that result from this process are unintentional.

## 2023-04-10 NOTE — Progress Notes (Signed)
Patient declines Hepatitis A vaccine at this time

## 2023-04-13 LAB — HCV FIBROSURE
ALPHA 2-MACROGLOBULINS, QN: 215 mg/dL (ref 110–276)
ALT (SGPT) P5P: 165 IU/L — ABNORMAL HIGH (ref 0–40)
Apolipoprotein A-1: 177 mg/dL (ref 116–209)
Bilirubin, Total: 0.6 mg/dL (ref 0.0–1.2)
Fibrosis Score: 0.3 — ABNORMAL HIGH (ref 0.00–0.21)
GGT: 99 IU/L — ABNORMAL HIGH (ref 0–60)
Haptoglobin: 37 mg/dL (ref 33–278)
Necroinflammat Activity Score: 0.75 — ABNORMAL HIGH (ref 0.00–0.17)

## 2023-04-13 LAB — HEPATITIS C GENOTYPE

## 2023-04-19 NOTE — Addendum Note (Signed)
Addended by: Roena Malady on: 04/19/2023 05:27 PM   Modules accepted: Orders

## 2023-09-18 ENCOUNTER — Ambulatory Visit: Payer: MEDICAID

## 2023-09-18 DIAGNOSIS — A599 Trichomoniasis, unspecified: Secondary | ICD-10-CM

## 2023-09-18 DIAGNOSIS — Z113 Encounter for screening for infections with a predominantly sexual mode of transmission: Secondary | ICD-10-CM

## 2023-09-18 DIAGNOSIS — Z3009 Encounter for other general counseling and advice on contraception: Secondary | ICD-10-CM

## 2023-09-18 DIAGNOSIS — A549 Gonococcal infection, unspecified: Secondary | ICD-10-CM

## 2023-09-18 LAB — WET PREP FOR TRICH, YEAST, CLUE
Trichomonas Exam: POSITIVE — AB
Yeast Exam: NEGATIVE

## 2023-09-18 MED ORDER — ETONOGESTREL-ETHINYL ESTRADIOL 0.12-0.015 MG/24HR VA RING: 1.0000 | VAGINAL_RING | VAGINAL | 1 refills | Status: AC

## 2023-09-18 MED ORDER — METRONIDAZOLE 500 MG PO TABS: 500.0000 mg | ORAL_TABLET | Freq: Two times a day (BID) | ORAL | Status: AC

## 2023-09-18 MED ORDER — CEFTRIAXONE SODIUM 500 MG IJ SOLR
500.0000 mg | Freq: Once | INTRAMUSCULAR | Status: AC
  Administered 2023-09-18: 500 mg via INTRAMUSCULAR

## 2023-09-18 NOTE — Progress Notes (Signed)
Pt is here for STD screening, Wet prep results reviewed with pt. The patient was dispensed metronidazole 500 mg 2x/day for 7 days and ceftriaxone 500 mg injection today. I provided counseling today regarding the medication. We discussed the medication, the side effects and when to call clinic. Patient given the opportunity to ask questions. Questions answered.  Condoms declined and Brochure given.

## 2023-09-18 NOTE — Progress Notes (Signed)
1 
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
## 2023-11-13 ENCOUNTER — Other Ambulatory Visit: Payer: Self-pay | Admitting: Family Medicine

## 2023-11-13 DIAGNOSIS — Z3009 Encounter for other general counseling and advice on contraception: Secondary | ICD-10-CM

## 2024-02-06 ENCOUNTER — Ambulatory Visit: Payer: MEDICAID

## 2024-02-06 VITALS — BP 117/68 | Ht 63.0 in | Wt 148.0 lb

## 2024-02-06 DIAGNOSIS — Z309 Encounter for contraceptive management, unspecified: Secondary | ICD-10-CM | POA: Diagnosis not present

## 2024-02-06 DIAGNOSIS — Z3201 Encounter for pregnancy test, result positive: Secondary | ICD-10-CM | POA: Diagnosis not present

## 2024-02-06 LAB — PREGNANCY, URINE: Preg Test, Ur: POSITIVE — AB

## 2024-02-06 MED ORDER — PRENATAL 27-0.8 MG PO TABS: 1.0000 | ORAL_TABLET | Freq: Every day | ORAL | Status: AC

## 2024-02-06 NOTE — Progress Notes (Signed)
 UPT positive. Patient currently taking prescribed medication and needed counseling on whether they are okay to take during pregnancy. Consulted with Fain Home, FNP who advises patient speak with prescribing physician about medications and pregnancy. Also counseled patient that she would need to be seen by high risk OBGYN in order to be closely monitored while on medications during pregnancy. Patient agrees and will receive prenatal care at [facility].   The patient was dispensed prenatal vitamins #100 today. I provided counseling today regarding the medication. We discussed the medication, the side effects and when to call clinic.   Positive pregnancy packet reviewed and given to patient. Also counseled on hydration and when to seek medical attention.   Patient given the opportunity to ask questions. Questions answered.   Clare Critchley, RN

## 2024-07-22 ENCOUNTER — Other Ambulatory Visit: Payer: Self-pay

## 2024-07-22 ENCOUNTER — Emergency Department: Payer: MEDICAID

## 2024-07-22 ENCOUNTER — Emergency Department
Admission: EM | Admit: 2024-07-22 | Discharge: 2024-07-22 | Disposition: A | Discharge status: Home / Self Care | Payer: MEDICAID | Attending: Emergency Medicine | Admitting: Emergency Medicine

## 2024-07-22 ENCOUNTER — Encounter: Payer: Self-pay | Admitting: Emergency Medicine

## 2024-07-22 DIAGNOSIS — O26891 Other specified pregnancy related conditions, first trimester: Secondary | ICD-10-CM | POA: Diagnosis present

## 2024-07-22 DIAGNOSIS — R102 Pelvic and perineal pain unspecified side: Secondary | ICD-10-CM | POA: Diagnosis not present

## 2024-07-22 DIAGNOSIS — Z3A00 Weeks of gestation of pregnancy not specified: Secondary | ICD-10-CM | POA: Insufficient documentation

## 2024-07-22 HISTORY — DX: Attention-deficit hyperactivity disorder, unspecified type: F90.9

## 2024-07-22 LAB — ABO/RH: ABO/RH(D): O NEG

## 2024-07-22 LAB — CBC WITH DIFFERENTIAL/PLATELET
Abs Immature Granulocytes: 0.03 K/uL (ref 0.00–0.07)
Basophils Absolute: 0 K/uL (ref 0.0–0.1)
Basophils Relative: 0 %
Eosinophils Absolute: 0.1 K/uL (ref 0.0–0.5)
Eosinophils Relative: 2 %
HCT: 41.2 % (ref 36.0–46.0)
Hemoglobin: 14.1 g/dL (ref 12.0–15.0)
Immature Granulocytes: 0 %
Lymphocytes Relative: 34 %
Lymphs Abs: 3 K/uL (ref 0.7–4.0)
MCH: 31.3 pg (ref 26.0–34.0)
MCHC: 34.2 g/dL (ref 30.0–36.0)
MCV: 91.4 fL (ref 80.0–100.0)
Monocytes Absolute: 0.7 K/uL (ref 0.1–1.0)
Monocytes Relative: 8 %
Neutro Abs: 5 K/uL (ref 1.7–7.7)
Neutrophils Relative %: 56 %
Platelets: 283 K/uL (ref 150–400)
RBC: 4.51 MIL/uL (ref 3.87–5.11)
RDW: 12.2 % (ref 11.5–15.5)
WBC: 8.9 K/uL (ref 4.0–10.5)
nRBC: 0 % (ref 0.0–0.2)

## 2024-07-22 LAB — URINALYSIS, ROUTINE W REFLEX MICROSCOPIC
Bilirubin Urine: NEGATIVE
Glucose, UA: NEGATIVE mg/dL
Hgb urine dipstick: NEGATIVE
Ketones, ur: NEGATIVE mg/dL
Leukocytes,Ua: NEGATIVE
Nitrite: NEGATIVE
Protein, ur: NEGATIVE mg/dL
Specific Gravity, Urine: 1.017 (ref 1.005–1.030)
pH: 6 (ref 5.0–8.0)

## 2024-07-22 LAB — BASIC METABOLIC PANEL WITH GFR
Anion gap: 11 (ref 5–15)
BUN: 10 mg/dL (ref 6–20)
CO2: 24 mmol/L (ref 22–32)
Calcium: 9.5 mg/dL (ref 8.9–10.3)
Chloride: 103 mmol/L (ref 98–111)
Creatinine, Ser: 0.52 mg/dL (ref 0.44–1.00)
GFR, Estimated: >60 mL/min (ref >60)
Glucose, Bld: 78 mg/dL (ref 70–99)
Potassium: 3.4 mmol/L — ABNORMAL LOW (ref 3.5–5.1)
Sodium: 138 mmol/L (ref 135–145)

## 2024-07-22 LAB — HCG, QUANTITATIVE, PREGNANCY: hCG, Beta Chain, Quant, S: 13569 m[IU]/mL — ABNORMAL HIGH (ref <5)

## 2024-07-22 LAB — PREGNANCY, URINE: Preg Test, Ur: POSITIVE — AB

## 2024-07-22 NOTE — ED Notes (Signed)
 RN to bedside to introduce self to pt. Pt appears to be under influence of drugs. Pt then states I done meth and I have been clean for 2 years so dont tell my client who is here with me and I am her peer support person. I took a test today and it was positive. Per MAR pt is [redacted] weeks pregnant.

## 2024-07-22 NOTE — ED Triage Notes (Signed)
 Patient to ED via POV for pelvic pain with nausea. Symptoms started today. States she had a positive pregnancy test- period 1 month late. Asking to see the baby today. PT reports she relapse on meth 2 weeks ago and then found out she was pregnant.

## 2024-07-22 NOTE — ED Notes (Signed)
 RN to bedside per US  tech. Pt is no longer in the room. Pt has left the building.

## 2024-07-22 NOTE — ED Provider Notes (Signed)
 [facility] Provider Note    Event Date/Time   First MD Initiated Contact with Patient 07/22/24 1642     (approximate)   History   Pelvic Pain   HPI  Dominique Frost is a 33 y.o. female with history of drug dependency, alcohol dependency presents emergency department stating that she missed her period this month.  States last period was over a month ago.  Had unprotected sex.  States she has been clean for 2 years but did meth last night and is now worried about the baby.  Having pelvic pain.  No discharge, no bleeding.      Physical Exam   Triage Vital Signs: ED Triage Vitals  Encounter Vitals Group     BP 07/22/24 1520 128/79     Girls Systolic BP Percentile --      Girls Diastolic BP Percentile --      Boys Systolic BP Percentile --      Boys Diastolic BP Percentile --      Pulse Rate 07/22/24 1520 (!) 114     Resp 07/22/24 1520 18     Temp 07/22/24 1520 98.2 F (36.8 C)     Temp Source 07/22/24 1520 Oral     SpO2 07/22/24 1520 100 %     Weight 07/22/24 1521 130 lb (59 kg)     Height 07/22/24 1521 5' 3 (1.6 m)     Head Circumference --      Peak Flow --      Pain Score 07/22/24 1521 6     Pain Loc --      Pain Education --      Exclude from Growth Chart --     Most recent vital signs: Vitals:   07/22/24 1520  BP: 128/79  Pulse: (!) 114  Resp: 18  Temp: 98.2 F (36.8 C)  SpO2: 100%     General: Awake, no distress.   CV:  Good peripheral perfusion.  Resp:  Normal effort.  Abd:  No distention.  Nontender Other:      ED Results / Procedures / Treatments   Labs (all labs ordered are listed, but only abnormal results are displayed) Labs Reviewed  URINALYSIS, ROUTINE W REFLEX MICROSCOPIC - Abnormal; Notable for the following components:      Result Value   Color, Urine YELLOW (*)    APPearance HAZY (*)    All other components within normal limits  HCG, QUANTITATIVE, PREGNANCY - Abnormal; Notable for the following  components:   hCG, Beta Chain, Quant, S 13,569 (*)    All other components within normal limits  BASIC METABOLIC PANEL WITH GFR - Abnormal; Notable for the following components:   Potassium 3.4 (*)    All other components within normal limits  CBC WITH DIFFERENTIAL/PLATELET  PREGNANCY, URINE  ABO/RH     EKG     RADIOLOGY Ultrasound OB less than 14 weeks    PROCEDURES:   Procedures  Critical Care:  no Chief Complaint  Patient presents with   Pelvic Pain      MEDICATIONS ORDERED IN ED: Medications - No data to display   IMPRESSION / MDM / ASSESSMENT AND PLAN / ED COURSE  I reviewed the triage vital signs and the nursing notes.                              Differential diagnosis includes, but is not limited to,  implantation, round ligament pain, ectopic, threatened miscarriage, miscarriage, drug use in pregnancy  Patient's presentation is most consistent with acute illness / injury with system symptoms.    Medications given  Labs ordered, imaging ordered  CBC and metabolic panel are reassuring, beta-hCG is 13,569 which would be appropriate for a 1 month pregnancy.  ABO/Rh is O-.  However the patient has not had any vaginal bleeding.   Ultrasound OB less than 14 weeks  Plan ultrasound got to the exam room patient is no longer in the room and gown is on the bed.  Patient eloped from the room after my evaluation.   FINAL CLINICAL IMPRESSION(S) / ED DIAGNOSES   Final diagnoses:  Pelvic pain in female     Rx / DC Orders   ED Discharge Orders     None        Note:  This document was prepared using Dragon voice recognition software and may include unintentional dictation errors.    Gasper Devere ORN, PA-C 07/22/24 1754    Bradler, Evan K, MD 07/22/24 [phone]

## 2024-10-05 ENCOUNTER — Emergency Department: Payer: MEDICAID

## 2024-10-05 ENCOUNTER — Emergency Department
Admission: EM | Admit: 2024-10-05 | Discharge: 2024-10-05 | Disposition: A | Discharge status: Home / Self Care | Payer: MEDICAID | Attending: Emergency Medicine | Admitting: Emergency Medicine

## 2024-10-05 DIAGNOSIS — Y92410 Unspecified street and highway as the place of occurrence of the external cause: Secondary | ICD-10-CM | POA: Diagnosis not present

## 2024-10-05 DIAGNOSIS — M542 Cervicalgia: Secondary | ICD-10-CM | POA: Insufficient documentation

## 2024-10-05 DIAGNOSIS — M25552 Pain in left hip: Secondary | ICD-10-CM | POA: Insufficient documentation

## 2024-10-05 DIAGNOSIS — O99891 Other specified diseases and conditions complicating pregnancy: Secondary | ICD-10-CM | POA: Insufficient documentation

## 2024-10-05 DIAGNOSIS — M545 Low back pain, unspecified: Secondary | ICD-10-CM | POA: Insufficient documentation

## 2024-10-05 DIAGNOSIS — O26892 Other specified pregnancy related conditions, second trimester: Secondary | ICD-10-CM | POA: Diagnosis present

## 2024-10-05 DIAGNOSIS — M7918 Myalgia, other site: Secondary | ICD-10-CM

## 2024-10-05 DIAGNOSIS — Z3A16 16 weeks gestation of pregnancy: Secondary | ICD-10-CM | POA: Insufficient documentation

## 2024-10-05 DIAGNOSIS — M25551 Pain in right hip: Secondary | ICD-10-CM | POA: Insufficient documentation

## 2024-10-05 MED ORDER — ACETAMINOPHEN 325 MG PO TABS
650.0000 mg | ORAL_TABLET | Freq: Once | ORAL | Status: AC
  Administered 2024-10-05: 650 mg via ORAL
  Filled 2024-10-05: qty 2
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
## 2024-10-05 NOTE — Discharge Instructions (Addendum)
 The ultrasound of your baby was normal.  The x-ray of your neck did not show any fractures.  You can take 650 mg of Tylenol  every 6 hours as needed for pain. You can use ice, heat, muscle creams and other topical pain relievers as well.  You will likely be more sore tomorrow than you are today, this is normal. After this your pain should slowly be improving. If not, you need to be seen by another health care provider. This could be your PCP, urgent care or in the emergency department. Return to the emergency department specifically, if you have development of chest pain, shortness of breath, abdominal pain, new abdominal bruising or any other symptom personally concerning to you.
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
## 2024-12-02 ENCOUNTER — Ambulatory Visit: Payer: MEDICAID | Admitting: Physician Assistant
